# Patient Record
Sex: Male | Born: 1935 | Race: White | Hispanic: No
Health system: Southern US, Community
[De-identification: ages and names within clinical notes are randomized; demographics above are authoritative.]

---

## 2012-04-09 ENCOUNTER — Inpatient Hospital Stay: Payer: Self-pay | Admitting: Internal Medicine

## 2012-04-09 LAB — URINALYSIS, COMPLETE
Bilirubin,UR: NEGATIVE
Blood: NEGATIVE
Hyaline Cast: 1
Ketone: NEGATIVE
Leukocyte Esterase: NEGATIVE
Nitrite: NEGATIVE
Ph: 6 (ref 4.5–8.0)
Protein: NEGATIVE
RBC,UR: <1 /HPF (ref 0–5)
Squamous Epithelial: NONE SEEN

## 2012-04-09 LAB — TROPONIN I: Troponin-I: 0.03 ng/mL

## 2012-04-09 LAB — BASIC METABOLIC PANEL
BUN: 65 mg/dL — ABNORMAL HIGH (ref 7–18)
Calcium, Total: 9.2 mg/dL (ref 8.5–10.1)
Chloride: 97 mmol/L — ABNORMAL LOW (ref 98–107)
Creatinine: 1.76 mg/dL — ABNORMAL HIGH (ref 0.60–1.30)
Potassium: 4.5 mmol/L (ref 3.5–5.1)
Sodium: 134 mmol/L — ABNORMAL LOW (ref 136–145)

## 2012-04-09 LAB — CBC
HCT: 34.9 % — ABNORMAL LOW (ref 40.0–52.0)
HGB: 11.5 g/dL — ABNORMAL LOW (ref 13.0–18.0)
RBC: 3.52 10*6/uL — ABNORMAL LOW (ref 4.40–5.90)
WBC: 9.2 10*3/uL (ref 3.8–10.6)

## 2012-04-09 LAB — CK TOTAL AND CKMB (NOT AT ARMC)
CK, Total: 96 U/L (ref 35–232)
CK-MB: 0.8 ng/mL (ref 0.5–3.6)

## 2012-04-09 LAB — PHENOBARBITAL LEVEL: Phenobarbital: 27.3 ug/mL (ref 15.0–40.0)

## 2012-04-09 LAB — HEPATIC FUNCTION PANEL A (ARMC)
Albumin: 3.4 g/dL (ref 3.4–5.0)
Alkaline Phosphatase: 114 U/L (ref 50–136)
Bilirubin, Direct: 0.2 mg/dL (ref 0.00–0.20)
Bilirubin,Total: 0.5 mg/dL (ref 0.2–1.0)
SGPT (ALT): 18 U/L
Total Protein: 7.7 g/dL (ref 6.4–8.2)

## 2012-04-09 LAB — PRO B NATRIURETIC PEPTIDE: B-Type Natriuretic Peptide: 6654 pg/mL — ABNORMAL HIGH (ref 0–450)

## 2012-04-10 LAB — BASIC METABOLIC PANEL
Anion Gap: 10 (ref 7–16)
Calcium, Total: 9.1 mg/dL (ref 8.5–10.1)
Chloride: 98 mmol/L (ref 98–107)
Co2: 29 mmol/L (ref 21–32)
EGFR (African American): 38 — ABNORMAL LOW
Glucose: 220 mg/dL — ABNORMAL HIGH (ref 65–99)
Osmolality: 300 (ref 275–301)

## 2012-04-10 LAB — CBC WITH DIFFERENTIAL/PLATELET
Eosinophil %: 0.1 %
HGB: 11.1 g/dL — ABNORMAL LOW (ref 13.0–18.0)
Lymphocyte %: 6 %
MCH: 32.4 pg (ref 26.0–34.0)
Monocyte #: 0.2 x10 3/mm (ref 0.2–1.0)
Monocyte %: 2.1 %
Neutrophil %: 91.4 %
Platelet: 181 10*3/uL (ref 150–440)
RBC: 3.44 10*6/uL — ABNORMAL LOW (ref 4.40–5.90)
WBC: 9 10*3/uL (ref 3.8–10.6)

## 2012-04-10 LAB — CK TOTAL AND CKMB (NOT AT ARMC)
CK, Total: 87 U/L (ref 35–232)
CK-MB: 0.9 ng/mL (ref 0.5–3.6)

## 2012-04-10 LAB — TROPONIN I: Troponin-I: 0.06 ng/mL — ABNORMAL HIGH

## 2012-04-11 LAB — BASIC METABOLIC PANEL
Anion Gap: 7 (ref 7–16)
BUN: 75 mg/dL — ABNORMAL HIGH (ref 7–18)
Chloride: 97 mmol/L — ABNORMAL LOW (ref 98–107)
Creatinine: 2.05 mg/dL — ABNORMAL HIGH (ref 0.60–1.30)
EGFR (African American): 33 — ABNORMAL LOW
Glucose: 114 mg/dL — ABNORMAL HIGH (ref 65–99)

## 2012-04-12 LAB — BASIC METABOLIC PANEL
Anion Gap: 7 (ref 7–16)
Calcium, Total: 9.3 mg/dL (ref 8.5–10.1)
Creatinine: 1.73 mg/dL — ABNORMAL HIGH (ref 0.60–1.30)
EGFR (African American): 41 — ABNORMAL LOW
EGFR (Non-African Amer.): 35 — ABNORMAL LOW
Glucose: 164 mg/dL — ABNORMAL HIGH (ref 65–99)
Potassium: 3.9 mmol/L (ref 3.5–5.1)
Sodium: 139 mmol/L (ref 136–145)

## 2012-04-13 LAB — CBC WITH DIFFERENTIAL/PLATELET
Eosinophil #: 0 10*3/uL (ref 0.0–0.7)
Eosinophil %: 0.1 %
Lymphocyte #: 1 10*3/uL (ref 1.0–3.6)
MCH: 32.6 pg (ref 26.0–34.0)
MCHC: 32.4 g/dL (ref 32.0–36.0)
MCV: 101 fL — ABNORMAL HIGH (ref 80–100)
Monocyte #: 0.9 x10 3/mm (ref 0.2–1.0)
Monocyte %: 8 %
Neutrophil #: 9 10*3/uL — ABNORMAL HIGH (ref 1.4–6.5)
Neutrophil %: 83 %
Platelet: 185 10*3/uL (ref 150–440)
WBC: 10.9 10*3/uL — ABNORMAL HIGH (ref 3.8–10.6)

## 2012-04-15 LAB — COMPREHENSIVE METABOLIC PANEL
BUN: 40 mg/dL — ABNORMAL HIGH (ref 7–18)
Bilirubin,Total: 0.8 mg/dL (ref 0.2–1.0)
Chloride: 105 mmol/L (ref 98–107)
Creatinine: 1.52 mg/dL — ABNORMAL HIGH (ref 0.60–1.30)
EGFR (African American): 47 — ABNORMAL LOW
EGFR (Non-African Amer.): 41 — ABNORMAL LOW
Glucose: 109 mg/dL — ABNORMAL HIGH (ref 65–99)
Osmolality: 297 (ref 275–301)
Potassium: 3.4 mmol/L — ABNORMAL LOW (ref 3.5–5.1)
SGOT(AST): 36 U/L (ref 15–37)
SGPT (ALT): 38 U/L
Sodium: 144 mmol/L (ref 136–145)

## 2012-04-15 LAB — URINE CULTURE

## 2012-04-19 LAB — CULTURE, BLOOD (SINGLE)

## 2012-09-25 ENCOUNTER — Inpatient Hospital Stay: Payer: Self-pay | Admitting: Specialist

## 2012-09-25 LAB — APTT: Activated PTT: 24.1 secs (ref 23.6–35.9)

## 2012-09-25 LAB — CBC
HCT: 36.1 % — ABNORMAL LOW (ref 40.0–52.0)
HGB: 11.7 g/dL — ABNORMAL LOW (ref 13.0–18.0)
MCHC: 32.4 g/dL (ref 32.0–36.0)
MCV: 100 fL (ref 80–100)
RDW: 16.3 % — ABNORMAL HIGH (ref 11.5–14.5)
WBC: 6.5 10*3/uL (ref 3.8–10.6)

## 2012-09-25 LAB — COMPREHENSIVE METABOLIC PANEL
Alkaline Phosphatase: 156 U/L — ABNORMAL HIGH (ref 50–136)
Calcium, Total: 9 mg/dL (ref 8.5–10.1)
Co2: 29 mmol/L (ref 21–32)
EGFR (Non-African Amer.): 51 — ABNORMAL LOW
Glucose: 88 mg/dL (ref 65–99)
SGPT (ALT): 21 U/L (ref 12–78)

## 2012-09-26 LAB — CBC WITH DIFFERENTIAL/PLATELET
Basophil %: 0.2 %
Eosinophil #: 0 10*3/uL (ref 0.0–0.7)
HCT: 34.8 % — ABNORMAL LOW (ref 40.0–52.0)
HGB: 11.2 g/dL — ABNORMAL LOW (ref 13.0–18.0)
Lymphocyte #: 0.7 10*3/uL — ABNORMAL LOW (ref 1.0–3.6)
Lymphocyte %: 6 %
MCH: 32.5 pg (ref 26.0–34.0)
Monocyte #: 0.8 x10 3/mm (ref 0.2–1.0)
Monocyte %: 6.7 %
Neutrophil #: 10 10*3/uL — ABNORMAL HIGH (ref 1.4–6.5)
Platelet: 137 10*3/uL — ABNORMAL LOW (ref 150–440)
RDW: 16.5 % — ABNORMAL HIGH (ref 11.5–14.5)
WBC: 11.5 10*3/uL — ABNORMAL HIGH (ref 3.8–10.6)

## 2012-09-26 LAB — BASIC METABOLIC PANEL
BUN: 54 mg/dL — ABNORMAL HIGH (ref 7–18)
Calcium, Total: 9 mg/dL (ref 8.5–10.1)
Creatinine: 1.43 mg/dL — ABNORMAL HIGH (ref 0.60–1.30)
EGFR (African American): 51 — ABNORMAL LOW
EGFR (Non-African Amer.): 44 — ABNORMAL LOW
Glucose: 175 mg/dL — ABNORMAL HIGH (ref 65–99)
Sodium: 140 mmol/L (ref 136–145)

## 2012-09-26 LAB — PROTIME-INR: Prothrombin Time: 13.6 secs (ref 11.5–14.7)

## 2012-09-27 LAB — POTASSIUM: Potassium: 4.9 mmol/L (ref 3.5–5.1)

## 2012-09-28 LAB — BASIC METABOLIC PANEL
BUN: 53 mg/dL — ABNORMAL HIGH (ref 7–18)
Calcium, Total: 8.5 mg/dL (ref 8.5–10.1)
EGFR (Non-African Amer.): 45 — ABNORMAL LOW
Glucose: 128 mg/dL — ABNORMAL HIGH (ref 65–99)
Osmolality: 292 (ref 275–301)
Potassium: 4.3 mmol/L (ref 3.5–5.1)
Sodium: 138 mmol/L (ref 136–145)

## 2012-09-28 LAB — HEMOGLOBIN: HGB: 10.3 g/dL — ABNORMAL LOW (ref 13.0–18.0)

## 2012-09-29 LAB — HEMOGLOBIN: HGB: 10.3 g/dL — ABNORMAL LOW (ref 13.0–18.0)

## 2012-09-30 LAB — BASIC METABOLIC PANEL
BUN: 38 mg/dL — ABNORMAL HIGH (ref 7–18)
Co2: 26 mmol/L (ref 21–32)
Creatinine: 1.04 mg/dL (ref 0.60–1.30)
EGFR (African American): >60
EGFR (Non-African Amer.): >60
Sodium: 136 mmol/L (ref 136–145)

## 2012-09-30 LAB — CBC WITH DIFFERENTIAL/PLATELET
Basophil %: 0.6 %
Eosinophil #: 0.4 10*3/uL (ref 0.0–0.7)
Eosinophil %: 5.1 %
HGB: 10.1 g/dL — ABNORMAL LOW (ref 13.0–18.0)
Lymphocyte #: 0.7 10*3/uL — ABNORMAL LOW (ref 1.0–3.6)
MCH: 32.1 pg (ref 26.0–34.0)
MCHC: 32.3 g/dL (ref 32.0–36.0)
MCV: 100 fL (ref 80–100)
Monocyte #: 0.7 x10 3/mm (ref 0.2–1.0)
Neutrophil #: 6.6 10*3/uL — ABNORMAL HIGH (ref 1.4–6.5)
RBC: 3.16 10*6/uL — ABNORMAL LOW (ref 4.40–5.90)

## 2012-12-08 ENCOUNTER — Ambulatory Visit: Payer: Self-pay | Admitting: Internal Medicine

## 2012-12-22 ENCOUNTER — Other Ambulatory Visit: Payer: Self-pay | Admitting: Family Medicine

## 2012-12-22 LAB — CBC WITH DIFFERENTIAL/PLATELET
Basophil %: 0.3 %
Eosinophil %: 0.2 %
Lymphocyte #: 1.8 10*3/uL (ref 1.0–3.6)
Lymphocyte %: 15.6 %
MCH: 32 pg (ref 26.0–34.0)
MCV: 98 fL (ref 80–100)
Monocyte #: 0.7 x10 3/mm (ref 0.2–1.0)
Neutrophil %: 77.5 %
Platelet: 246 10*3/uL (ref 150–440)
RDW: 16.5 % — ABNORMAL HIGH (ref 11.5–14.5)

## 2012-12-22 LAB — URINALYSIS, COMPLETE
Bilirubin,UR: NEGATIVE
Nitrite: NEGATIVE
Ph: 5 (ref 4.5–8.0)
Protein: 100
RBC,UR: 11 /HPF (ref 0–5)
Squamous Epithelial: NONE SEEN
WBC UR: 5082 /HPF (ref 0–5)

## 2012-12-22 LAB — COMPREHENSIVE METABOLIC PANEL
Albumin: 3.4 g/dL (ref 3.4–5.0)
Alkaline Phosphatase: 156 U/L — ABNORMAL HIGH (ref 50–136)
BUN: 135 mg/dL — ABNORMAL HIGH (ref 7–18)
Bilirubin,Total: 0.3 mg/dL (ref 0.2–1.0)
Calcium, Total: 8.8 mg/dL (ref 8.5–10.1)
Chloride: 98 mmol/L (ref 98–107)
Creatinine: 4.27 mg/dL — ABNORMAL HIGH (ref 0.60–1.30)
Osmolality: 313 (ref 275–301)
Potassium: 4.8 mmol/L (ref 3.5–5.1)
SGPT (ALT): 36 U/L (ref 12–78)
Sodium: 134 mmol/L — ABNORMAL LOW (ref 136–145)

## 2012-12-24 ENCOUNTER — Inpatient Hospital Stay: Payer: Self-pay | Admitting: Family Medicine

## 2012-12-24 LAB — URINALYSIS, COMPLETE
Bilirubin,UR: NEGATIVE
Glucose,UR: NEGATIVE mg/dL (ref 0–75)
Nitrite: NEGATIVE
Ph: 5 (ref 4.5–8.0)
Protein: 100
RBC,UR: 102 /HPF (ref 0–5)
Squamous Epithelial: 13

## 2012-12-24 LAB — COMPREHENSIVE METABOLIC PANEL
Albumin: 3.1 g/dL — ABNORMAL LOW (ref 3.4–5.0)
Anion Gap: 14 (ref 7–16)
BUN: 141 mg/dL — ABNORMAL HIGH (ref 7–18)
Chloride: 102 mmol/L (ref 98–107)
Co2: 23 mmol/L (ref 21–32)
Glucose: 91 mg/dL (ref 65–99)
Osmolality: 323 (ref 275–301)
SGOT(AST): 59 U/L — ABNORMAL HIGH (ref 15–37)
SGPT (ALT): 31 U/L (ref 12–78)
Total Protein: 8.4 g/dL — ABNORMAL HIGH (ref 6.4–8.2)

## 2012-12-24 LAB — URINE CULTURE

## 2012-12-24 LAB — CBC WITH DIFFERENTIAL/PLATELET
Basophil #: 0 10*3/uL (ref 0.0–0.1)
Basophil %: 0.2 %
Eosinophil %: 0.5 %
HCT: 36.7 % — ABNORMAL LOW (ref 40.0–52.0)
HGB: 11.8 g/dL — ABNORMAL LOW (ref 13.0–18.0)
Lymphocyte %: 9 %
MCHC: 32 g/dL (ref 32.0–36.0)
MCV: 97 fL (ref 80–100)
Monocyte #: 1 x10 3/mm (ref 0.2–1.0)
Monocyte %: 7.9 %
Neutrophil #: 10 10*3/uL — ABNORMAL HIGH (ref 1.4–6.5)
Neutrophil %: 82.4 %
Platelet: 223 10*3/uL (ref 150–440)
WBC: 12.1 10*3/uL — ABNORMAL HIGH (ref 3.8–10.6)

## 2012-12-24 LAB — CK TOTAL AND CKMB (NOT AT ARMC)
CK, Total: 272 U/L — ABNORMAL HIGH (ref 35–232)
CK-MB: 8.8 ng/mL — ABNORMAL HIGH (ref 0.5–3.6)
CK-MB: 6.5 ng/mL — ABNORMAL HIGH (ref 0.5–3.6)

## 2012-12-24 LAB — AMMONIA: Ammonia, Plasma: 55 mcmol/L — ABNORMAL HIGH (ref 11–32)

## 2012-12-24 LAB — TROPONIN I
Troponin-I: 0.13 ng/mL — ABNORMAL HIGH
Troponin-I: 0.24 ng/mL — ABNORMAL HIGH

## 2012-12-24 LAB — SODIUM, URINE, RANDOM: Sodium, Urine Random: 30 mmol/L (ref 20–110)

## 2012-12-25 LAB — CBC WITH DIFFERENTIAL/PLATELET
Basophil #: 0 10*3/uL (ref 0.0–0.1)
Eosinophil #: 0 10*3/uL (ref 0.0–0.7)
HCT: 34.1 % — ABNORMAL LOW (ref 40.0–52.0)
Lymphocyte %: 14.4 %
MCHC: 32.3 g/dL (ref 32.0–36.0)
Monocyte #: 0.6 x10 3/mm (ref 0.2–1.0)
Monocyte %: 6.7 %
Neutrophil #: 6.6 10*3/uL — ABNORMAL HIGH (ref 1.4–6.5)
Neutrophil %: 78.5 %
RBC: 3.48 10*6/uL — ABNORMAL LOW (ref 4.40–5.90)
WBC: 8.4 10*3/uL (ref 3.8–10.6)

## 2012-12-25 LAB — BASIC METABOLIC PANEL
Anion Gap: 16 (ref 7–16)
Co2: 18 mmol/L — ABNORMAL LOW (ref 21–32)
Creatinine: 1.95 mg/dL — ABNORMAL HIGH (ref 0.60–1.30)
EGFR (African American): 35 — ABNORMAL LOW
EGFR (Non-African Amer.): 30 — ABNORMAL LOW
Osmolality: 329 (ref 275–301)
Potassium: 4.3 mmol/L (ref 3.5–5.1)
Sodium: 147 mmol/L — ABNORMAL HIGH (ref 136–145)

## 2012-12-25 LAB — PROTEIN ELECTROPHORESIS(ARMC)

## 2012-12-25 LAB — AMMONIA: Ammonia, Plasma: 52 mcmol/L — ABNORMAL HIGH (ref 11–32)

## 2012-12-25 LAB — PROTEIN / CREATININE RATIO, URINE
Creatinine, Urine: 81.3 mg/dL (ref 30.0–125.0)
Protein, Random Urine: 73 mg/dL — ABNORMAL HIGH (ref 0–12)
Protein/Creat. Ratio: 898 mg/gCREAT — ABNORMAL HIGH (ref 0–200)

## 2012-12-25 LAB — CK TOTAL AND CKMB (NOT AT ARMC)
CK, Total: 180 U/L (ref 35–232)
CK-MB: 8.5 ng/mL — ABNORMAL HIGH (ref 0.5–3.6)

## 2012-12-26 LAB — BASIC METABOLIC PANEL
Anion Gap: 12 (ref 7–16)
Co2: 22 mmol/L (ref 21–32)
Creatinine: 1.43 mg/dL — ABNORMAL HIGH (ref 0.60–1.30)
EGFR (African American): 51 — ABNORMAL LOW
EGFR (Non-African Amer.): 44 — ABNORMAL LOW
Glucose: 109 mg/dL — ABNORMAL HIGH (ref 65–99)
Osmolality: 338 (ref 275–301)
Potassium: 4.1 mmol/L (ref 3.5–5.1)
Sodium: 156 mmol/L — ABNORMAL HIGH (ref 136–145)

## 2012-12-26 LAB — UR PROT ELECTROPHORESIS, URINE RANDOM

## 2012-12-26 LAB — URINE CULTURE

## 2012-12-27 LAB — CBC WITH DIFFERENTIAL/PLATELET
Basophil #: 0 10*3/uL (ref 0.0–0.1)
Eosinophil %: 0 %
HCT: 34.3 % — ABNORMAL LOW (ref 40.0–52.0)
HGB: 11.2 g/dL — ABNORMAL LOW (ref 13.0–18.0)
Lymphocyte #: 1 10*3/uL (ref 1.0–3.6)
Lymphocyte %: 12.5 %
MCHC: 32.5 g/dL (ref 32.0–36.0)
MCV: 99 fL (ref 80–100)
Neutrophil #: 6.4 10*3/uL (ref 1.4–6.5)
Neutrophil %: 76.3 %
RBC: 3.47 10*6/uL — ABNORMAL LOW (ref 4.40–5.90)
WBC: 8.3 10*3/uL (ref 3.8–10.6)

## 2012-12-27 LAB — BASIC METABOLIC PANEL
Anion Gap: 6 — ABNORMAL LOW (ref 7–16)
BUN: 71 mg/dL — ABNORMAL HIGH (ref 7–18)
Co2: 29 mmol/L (ref 21–32)
EGFR (African American): 46 — ABNORMAL LOW
EGFR (Non-African Amer.): 40 — ABNORMAL LOW
Glucose: 209 mg/dL — ABNORMAL HIGH (ref 65–99)
Osmolality: 331 (ref 275–301)
Potassium: 3.7 mmol/L (ref 3.5–5.1)
Sodium: 153 mmol/L — ABNORMAL HIGH (ref 136–145)

## 2012-12-29 LAB — CULTURE, BLOOD (SINGLE)

## 2013-01-08 ENCOUNTER — Ambulatory Visit: Payer: Self-pay | Admitting: Internal Medicine

## 2013-01-08 DEATH — deceased

## 2013-09-16 IMAGING — CT CT HEAD WITHOUT CONTRAST
1 of 2 series · 13 of 30 positions shown, 17 images · non-contrast
Comparison: none

REASON FOR EXAM: YELLING, CONFUSED
COMMENTS:

PROCEDURE:     CT  - CT HEAD WITHOUT CONTRAST  - December 24, 2012  [DATE]
RESULT:     Comparison:  09/24/2012, 09/25/2012
TECHNIQUE: Multiple axial images from the foramen magnum to the vertex were
obtained without IV contrast.

[Series 6: (id) · axial · 0.49mm/px · z∈[-129,-3]mm · 13 of 35 slices shown, 17 images]
[im 3/35  brain]
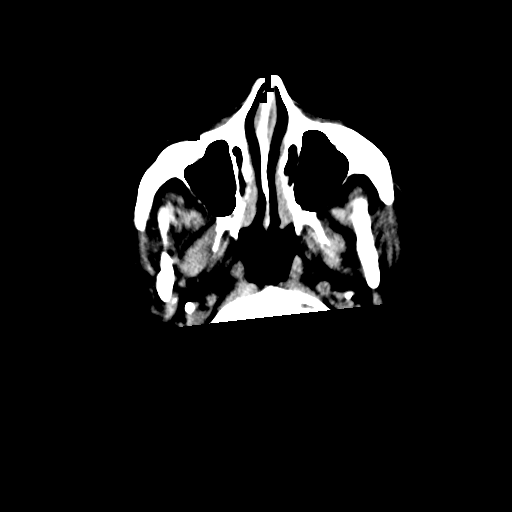
[im 3/35  bone]
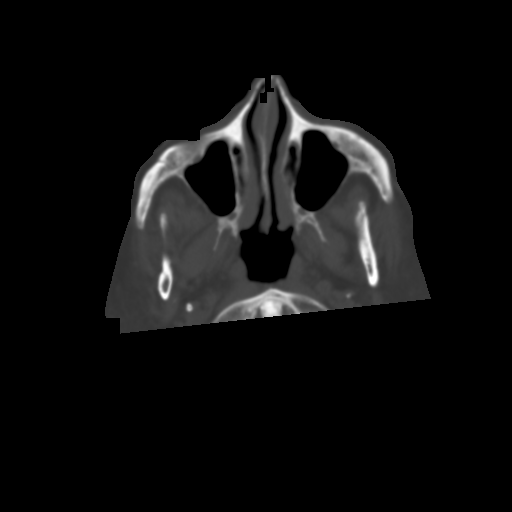
[im 5/35  brain]
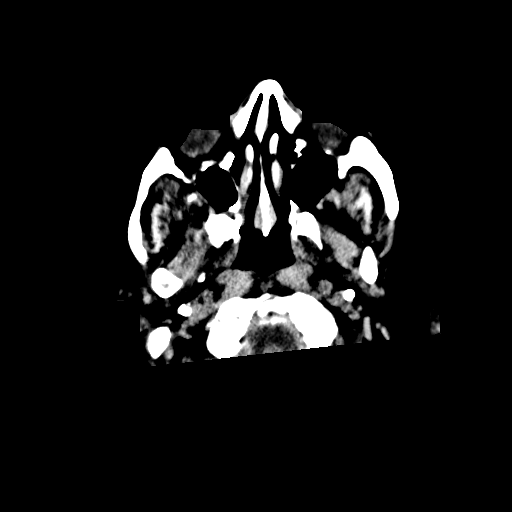
[im 8/35  brain]
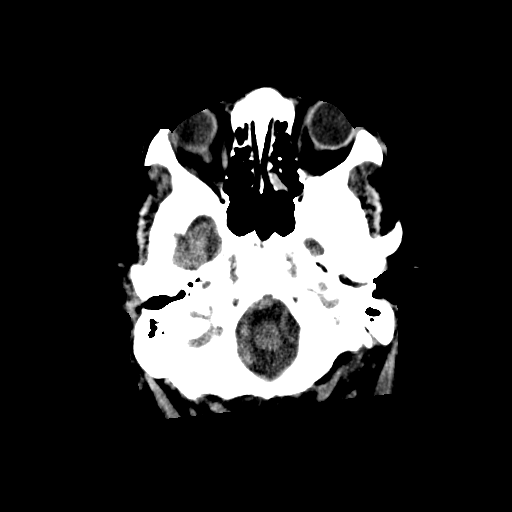
[im 10/35  brain]
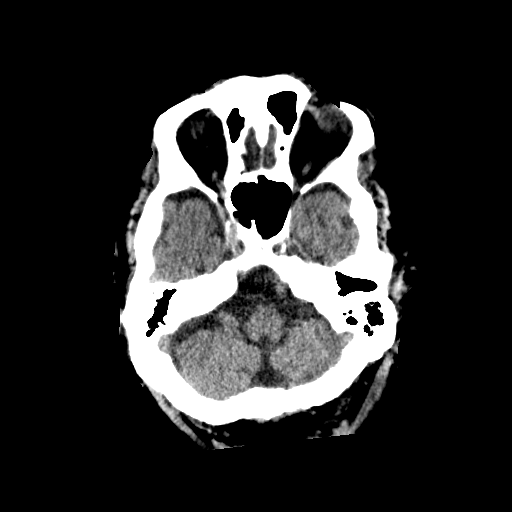
[im 13/35  brain]
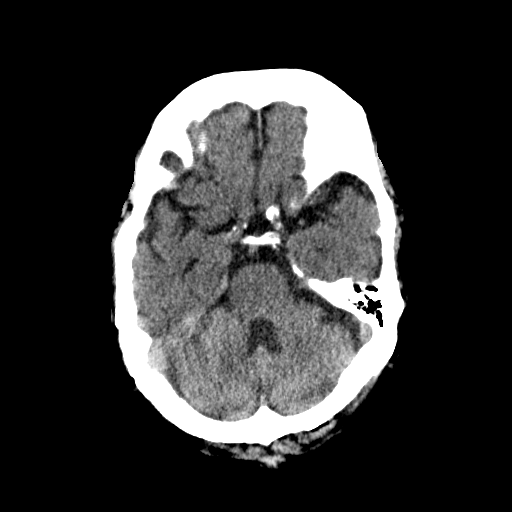
[im 13/35  bone]
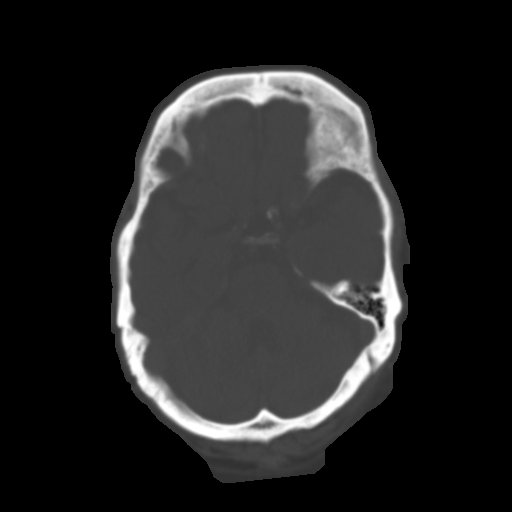
[im 15/35  brain]
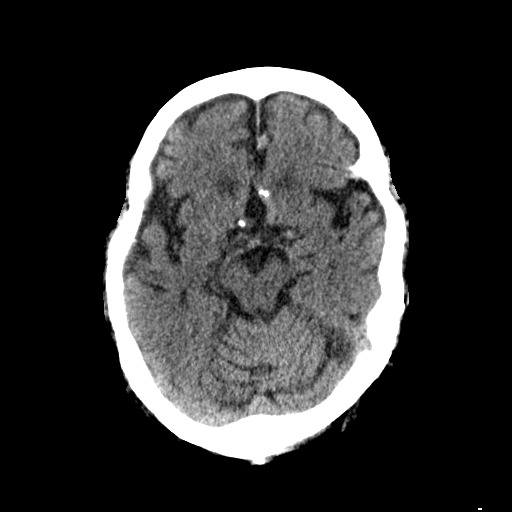
[im 18/35  brain]
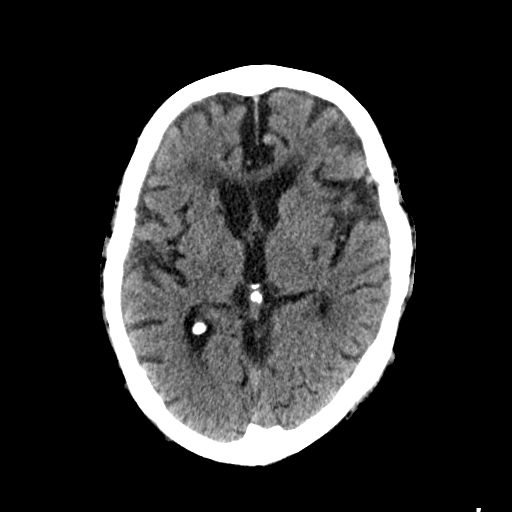
[im 20/35  brain]
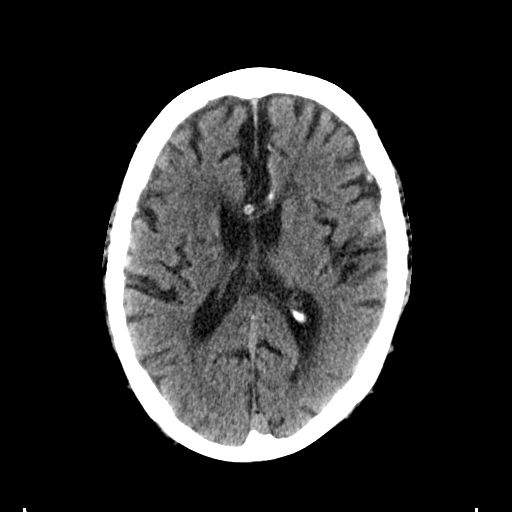
[im 22/35  brain]
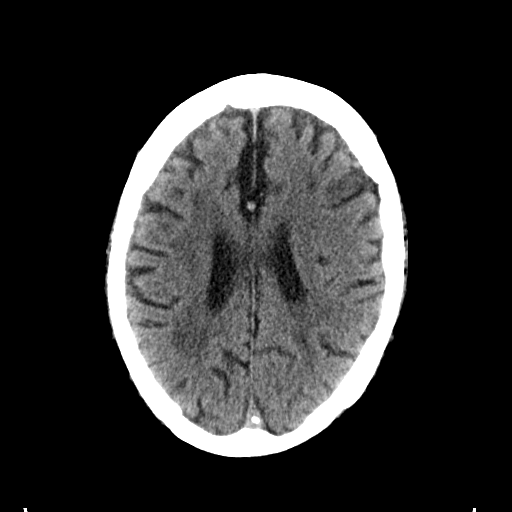
[im 22/35  bone]
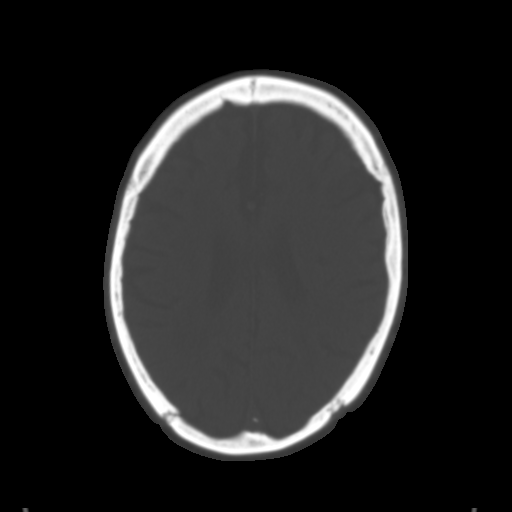
[im 25/35  brain]
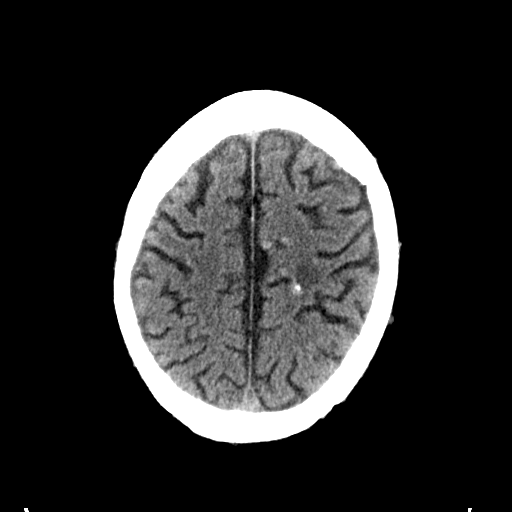
[im 27/35  brain]
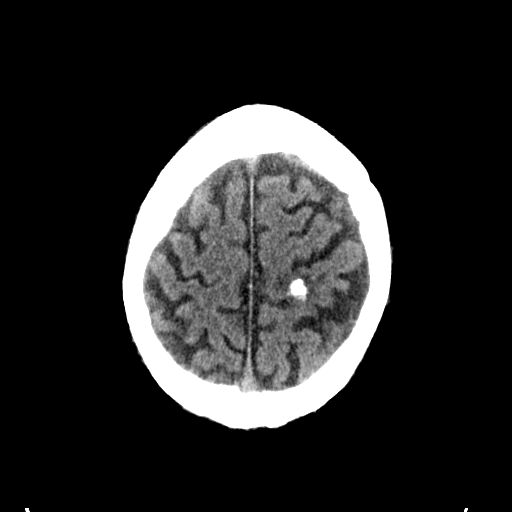
[im 30/35  brain]
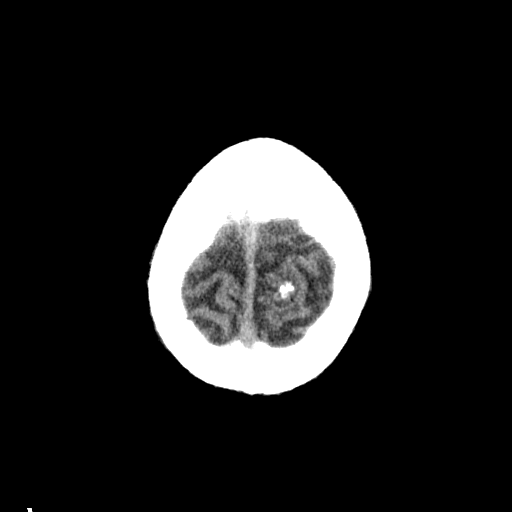
[im 32/35  brain]
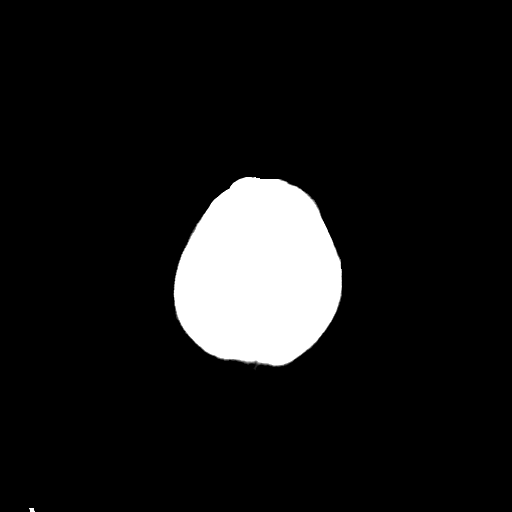
[im 32/35  bone]
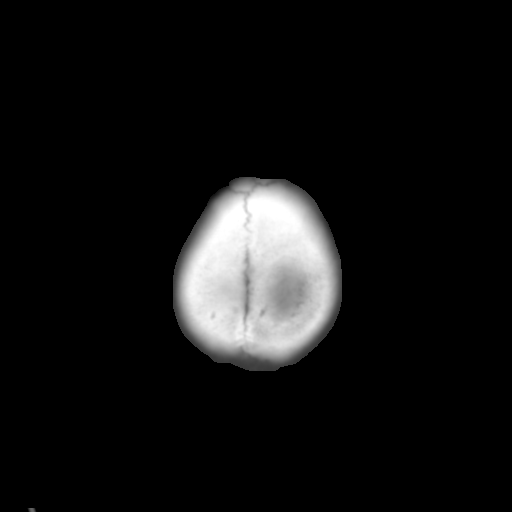

[13 of 30 positions shown; findings below may reference images not displayed]

FINDINGS: Periventricular and subcortical hypoattenuation is likely sequela chronic
microangiopathy. There is mild cerebral atrophy. No intracranial hemorrhage.
No extra-axial fluid collection. No evidence of acute cortical based
infarct. Coarse calcifications in the left frontoparietal lobe near the
vertex appear to be relatively tubular and in keeping with the previously
evaluated vascular malformation. The minimal adjacent hypoattenuation is
similar to prior.  There is an associated prominent, partially calcified
vessel in the falx.

The osseous structures are unremarkable.
IMPRESSION: 1. No acute intracranial process.
2. Chronic small vessel ischemic disease.
3. Findings in keeping with the previously described vascular malformation
in the left frontoparietal lobe.

## 2013-09-16 IMAGING — CT CT ABD-PELV W/O CM
1 of 2 series · 15 of 32 positions shown, 19 images · non-contrast
Comparison: none

REASON FOR EXAM: (1) abnormal ultrasound with free air in GB; (2)
abnormal ultrasound
COMMENTS:

PROCEDURE:     CT  - CT ABDOMEN AND PELVIS W[DATE] [DATE]
RESULT:     History: Gallstones. Possible emphysematous cholecystitis.

[Series 2: 3mm soft tissue · axial · 0.80mm/px · z∈[-773,-284]mm · 15 of 179 slices shown, 19 images]
[im 8/179  soft-tissue]
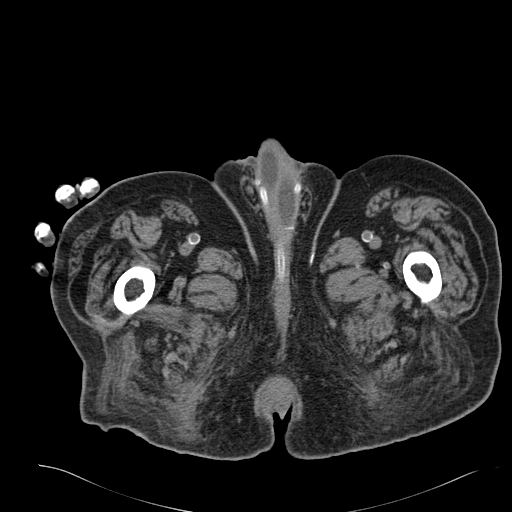
[im 8/179  bone]
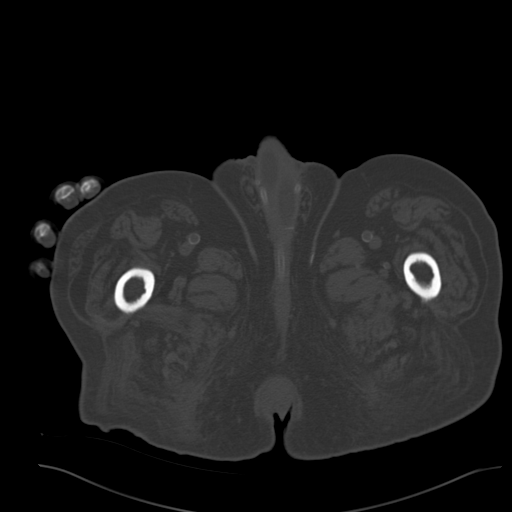
[im 23/179  soft-tissue]
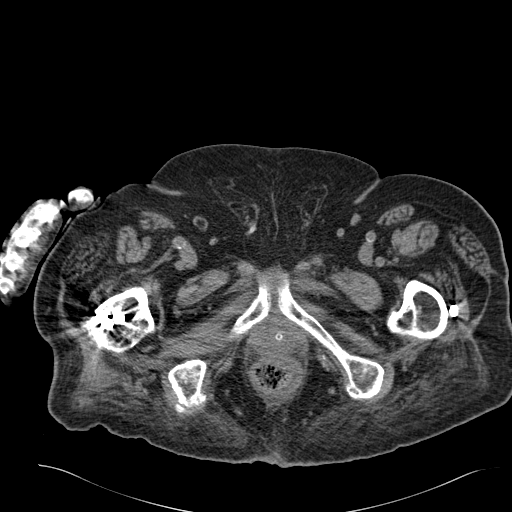
[im 38/179  soft-tissue]
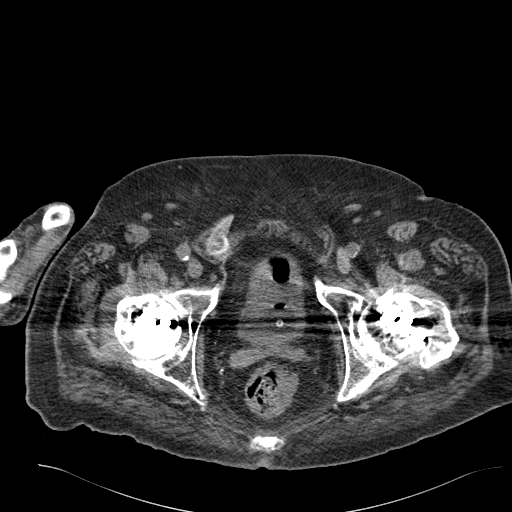
[im 52/179  soft-tissue]
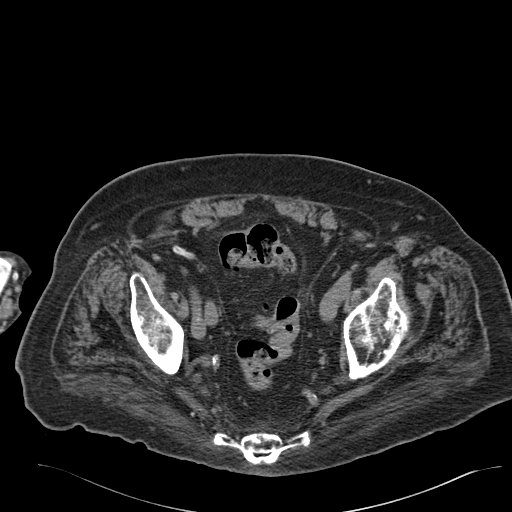
[im 60/179  soft-tissue]
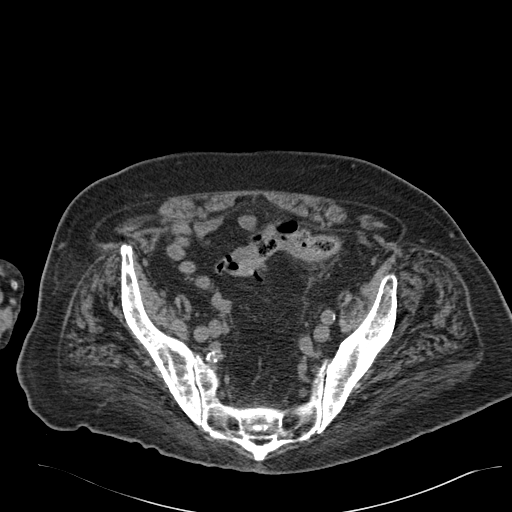
[im 75/179  soft-tissue]
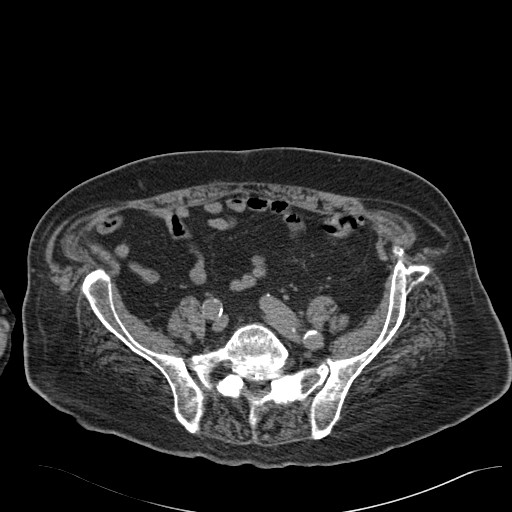
[im 90/179  soft-tissue]
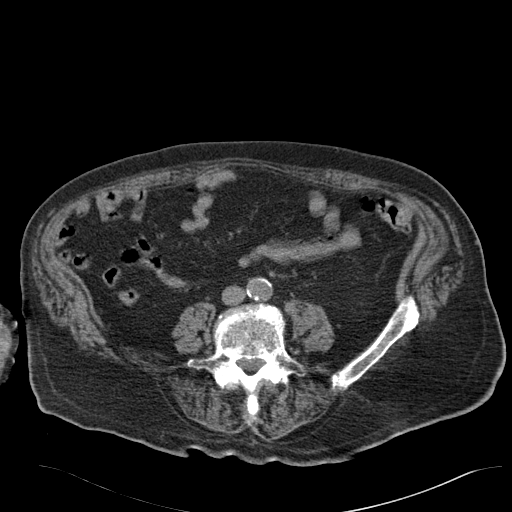
[im 104/179  soft-tissue]
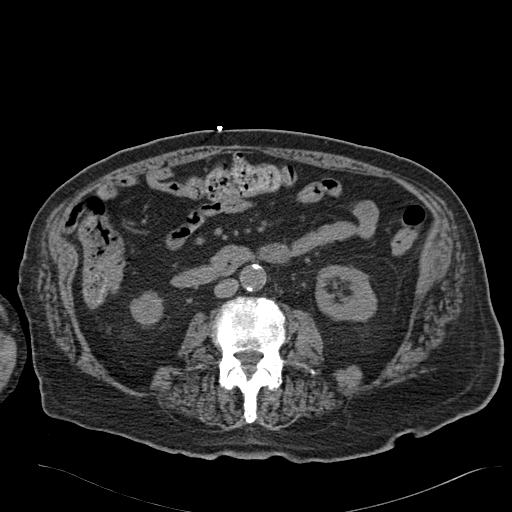
[im 119/179  soft-tissue]
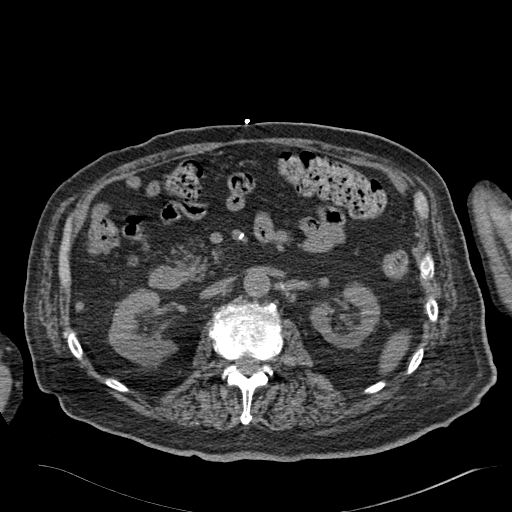
[im 119/179  bone]
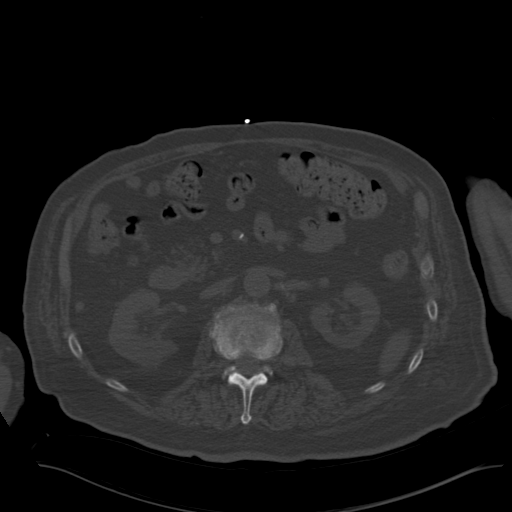
[im 127/179  soft-tissue]
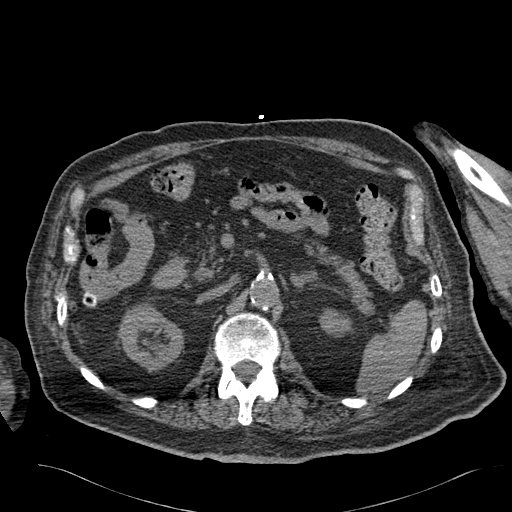
[im 141/179  soft-tissue]
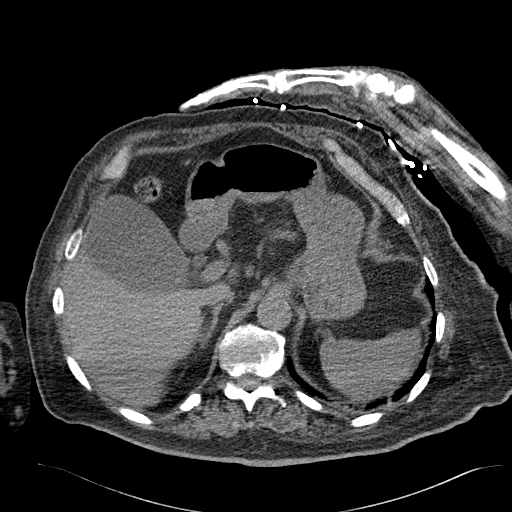
[im 149/179  lung]
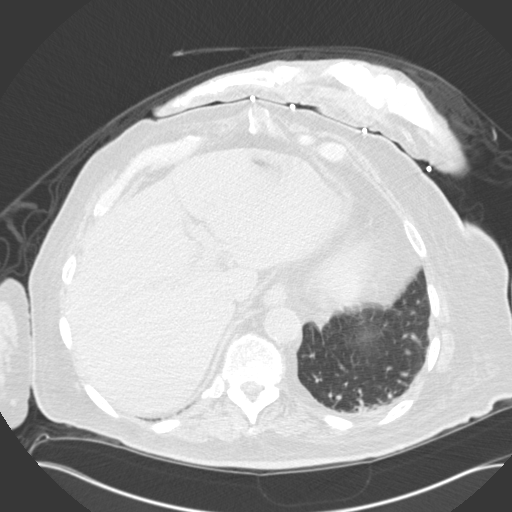
[im 156/179  soft-tissue]
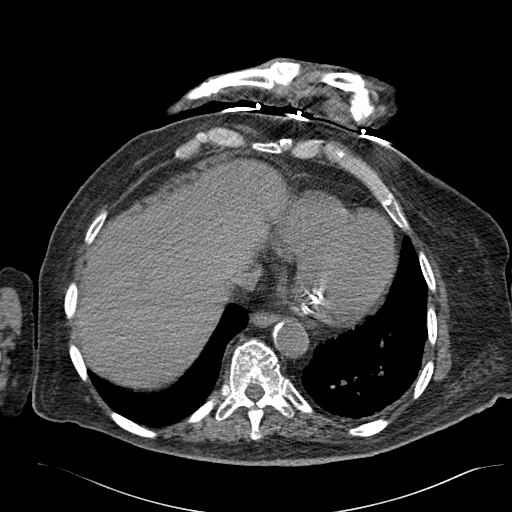
[im 156/179  lung]
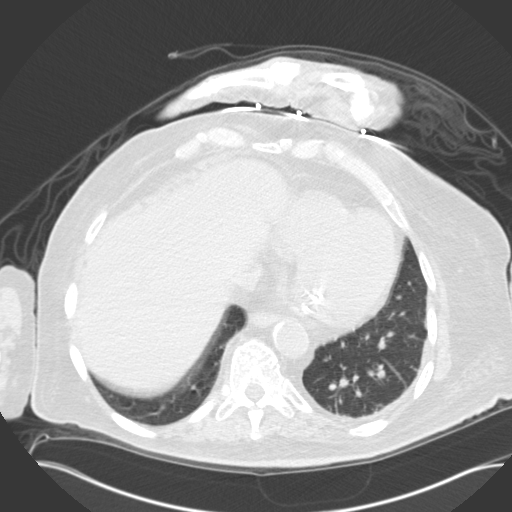
[im 164/179  lung]
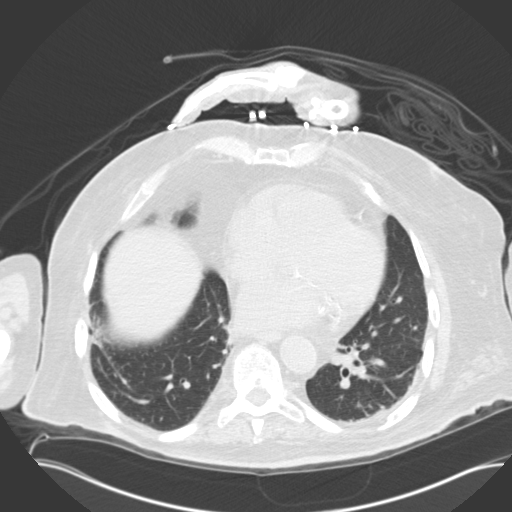
[im 171/179  soft-tissue]
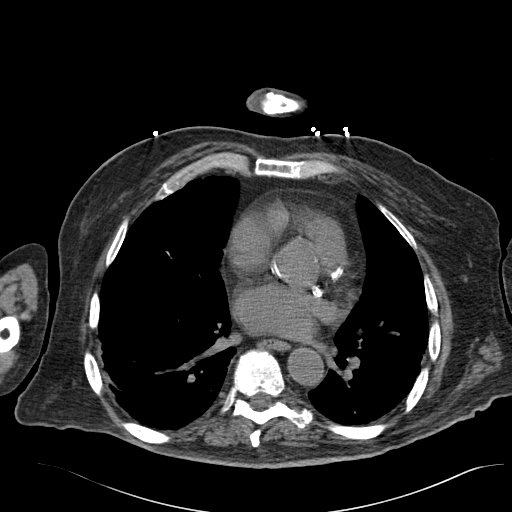
[im 171/179  lung]
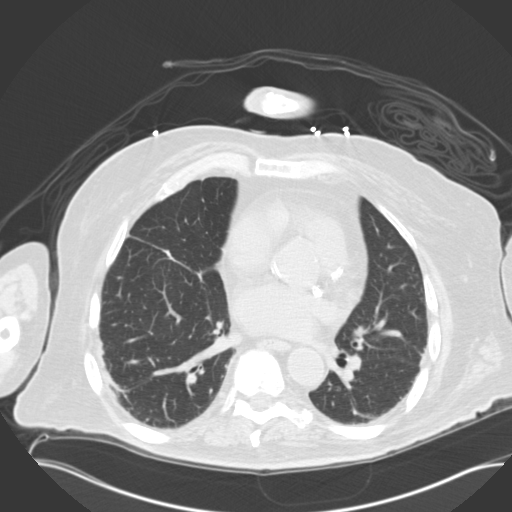

[15 of 32 positions shown; findings below may reference images not displayed]

FINDINGS: Standard nonenhanced CT obtained. Liver normal. Gallbladder is
distended and contains gallstones. No air noted within the gallbladder wall.
Common bile duct measures 11 mm. Pancreas is normal. Adrenals are normal. No
obstructing ureteral stone. No hydronephrosis. Aorta normal caliber.
Appendix normal. No evidence of bowel obstruction. The catheter bladder.
Bladder nondistended. Penile prosthesis. Prior bilateral hip surgery. Very
mild changes of sigmoid colonic diverticulitis cannot be excluded.
IMPRESSION: Distended gallbladder with gallstones. No evidence of
emphysematous cholecystitis. Common bile duct measures 11 mm. No obstructing
abnormality identified. Pancreas is unremarkable. Very mild changes of
sigmoid colonic diverticulitis cannot be excluded.

## 2013-09-18 IMAGING — CR DG CHEST 1V PORT
1 series · 1 of 1 positions shown · non-contrast
Comparison: none

REASON FOR EXAM: aspiration.
COMMENTS:

PROCEDURE:     DXR - DXR PORTABLE CHEST SINGLE VIEW  - December 26, 2012 [DATE]
RESULT:     Comparison: None

[ap]
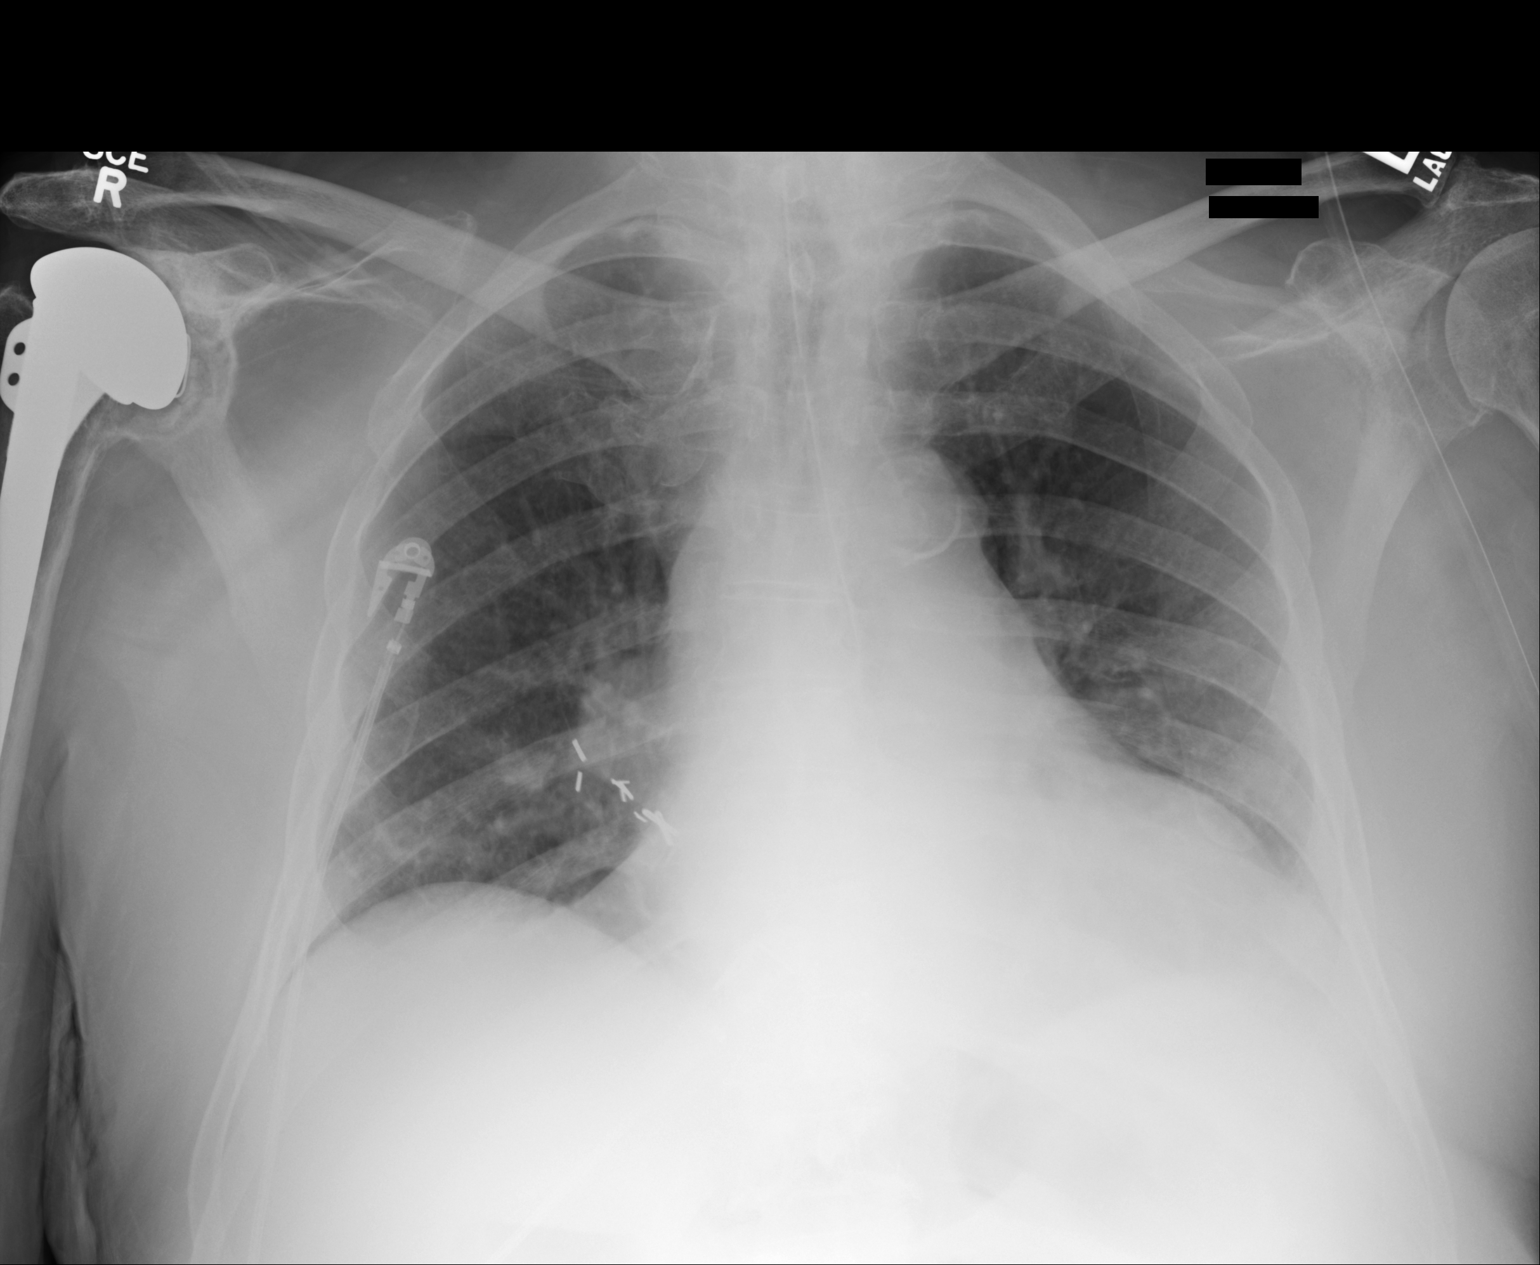

[1 of 1 positions shown; findings below may reference images not displayed]

FINDINGS: Single portable AP chest radiograph is provided.  There is no focal
parenchymal opacity, pleural effusion, or pneumothorax. Normal
cardiomediastinal silhouette. The osseous structures are unremarkable.
IMPRESSION: No acute disease of the che[REDACTED]

## 2015-01-27 NOTE — Op Note (Signed)
PATIENT NAME:  Clayton Campbell, Clayton Campbell MR#:  [id number] DATE OF BIRTH:  30-May-1924  DATE OF PROCEDURE:  09/27/2012  PREOPERATIVE DIAGNOSIS: Displaced left femoral neck fracture.   POSTOPERATIVE DIAGNOSIS: Displaced left femoral neck fracture.   PROCEDURE: Reduction and internal fixation of left femoral neck fracture.   SURGEON: Myra Rudehristopher Dewitt Judice, MD.   ANESTHESIA: General.   COMPLICATIONS: None.   ESTIMATED BLOOD LOSS: 50 mL.   DESCRIPTION OF PROCEDURE: Clindamycin 600 mg was given intravenously prior to the procedure. General anesthesia is induced. The patient is placed on the fracture table and secured in the usual manner for left hip surgery. On the original films, the femoral head is displaced approximately 50% posteriorly. Reduction is performed as best possible by longitudinal traction and internal rotation of the hip. This is checked in the AP and lateral views and is seen to be satisfactory using the C-arm. The left hip is then thoroughly prepped with alcohol and ChloraPrep and draped in the standard sterile fashion. Under C-arm control, three 7.3 cannulated screws are inserted into the femoral head and neck and seen to be in good position using the C-arm. One of the screws is angulated posteriorly to ensure good stability. The wounds were thoroughly irrigated multiple times and closed with staples. A soft bulky dressing is applied. The patient is returned to the recovery room in satisfactory condition, having tolerated the procedure quite well.    ____________________________ Clare Gandyhristopher E. Paiton Boultinghouse, MD ces:gb D: 09/27/2012 11:34:51 ET T: 09/27/2012 21:49:19 ET JOB#: [id number]  cc: Clare Gandyhristopher E. Tiasha Helvie, MD, <Dictator> Clare GandyHRISTOPHER E Cliford Sequeira MD ELECTRONICALLY SIGNED 09/28/2012 13:58

## 2015-01-27 NOTE — Consult Note (Signed)
Brief Consult Note: Diagnosis: Pre-op ortho surgery, AFIB,CHF.   Patient was seen by consultant.   Consult note dictated.   Recommend to proceed with surgery or procedure.   Orders entered.   Discussed with Attending MD.   Comments: IMP Fx Ankle Pre-op surgery AFIB COPD CHF CRI Hyperlipidemia Obesity . Plan Continue current meds Acceptable surgical risk mild/mod risk I do not rec further studies I will follow with you post op.  Electronic Signatures: Dorothyann Pengallwood, Dwayne D (MD)  (Signed 18-Dec-13 07:51)  Authored: Brief Consult Note   Last Updated: 18-Dec-13 07:51 by Alwyn Peaallwood, Dwayne D (MD)

## 2015-01-27 NOTE — Consult Note (Signed)
PATIENT NAME:  Clayton Campbell, CURRENT MR#:  [id number] DATE OF BIRTH:  1924/08/20  DATE OF CONSULTATION:  09/25/2012  ADMITTING PHYSICIAN: Dr. Reita Chard CONSULTING PHYSICIAN:  Dr. Carney Corners. Sorren Vallier PRIMARY CARE PHYSICIAN: Dr. Dorothey Baseman and also [facility].   REASON FOR ADMISSION: Left femoral neck fracture status post fall.   REASON FOR CONSULTATION: Medical management.   HISTORY OF PRESENT ILLNESS: Clayton Campbell is an 81 year old pleasant Caucasian male with past medical history of chronic obstructive pulmonary disease, history of diastolic congestive heart failure. The last time admitted to this hospital was July 1st and discharged after 9 days and treated for acute respiratory distress secondary to diastolic congestive heart failure. The patient has multiple medical problems. He was doing relatively well at home; however, the patient states that he was ready to go to bed and while walking he fell forward, and this resulted in severe pain in the left hip area, and he has some contusion to the anterior chest and right shoulder. The patient was brought to the hospital for evaluation, and findings here was consistent with left femoral neck fracture. The patient was admitted to the orthopedic surgical services and medical consult was requested.   REVIEW OF SYSTEMS:  CONSTITUTIONAL: Denies any fever. No chills. No fatigue.  EYES: No blurring of vision. No double vision.  ENT: He has moderate hearing impairment. This is chronic for him. No sore throat. No dysphagia.  CARDIOVASCULAR: No chest pain other than the contusion to the chest and shoulder. No active shortness of breath, but he has chronic shortness of breath. He has edema of lower extremities. This is, again, chronic. No syncope or true loss of consciousness.  RESPIRATORY: No cough. No sputum production. No hemoptysis.  GASTROINTESTINAL: No abdominal pain. No vomiting. No diarrhea.  GENITOURINARY: No dysuria. No frequency of urination.   MUSCULOSKELETAL: Apart from the left hip pain and his chronic right shoulder pain, he has no other joint pain, no joint swelling. No muscular pain or swelling other than the left hip pain and tenderness.  INTEGUMENTARY: No skin rash or ulcers, but he has multiple subcutaneous hematomas.   NEUROLOGY: No focal weakness. No recent seizure activity. No headache.  PSYCHIATRY: No anxiety, but he has history of depression.  ENDOCRINE: No polyuria or polydipsia. No heat or cold intolerance.   PAST MEDICAL HISTORY: Again, last admission to this hospital was in July. He stayed about 9 days, treated for respiratory distress secondary to diastolic congestive heart failure. He had an echocardiogram at that time which showed ejection fraction of 55%. He had concentric left ventricular hypertrophy. The patient also has chronic obstructive pulmonary disease, seizure disorder, history of cerebrovascular accident, history also of cerebral arteriovenous malformation,  cerebral aneurysm, history of degenerative joint disease and osteoarthritis, systemic hypertension, hyperlipidemia, coronary artery disease, history of pulmonary embolus, history of rectal bleeding, also suspected to have obstructive sleep apnea. The patient has chronic atrial fibrillation.  PAST SURGICAL HISTORY: Right shoulder replacement, right hip surgery. He states the right hip surgery is secondary to fall, and the fracture was done at [facility] six months ago. The patient also has history of gout.   SOCIAL HABITS: Nonsmoker. He has a remote history of smoking just lightly every now and then, but that was only for a few years. No history of alcoholism.   SOCIAL HISTORY: He is married, living with his wife. 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   FAMILY HISTORY: His father died from a kind of blood  problem. His brother died of lung cancer. His mother died of unknown disease.   ADMISSION MEDICATIONS: Enalapril 5 mg once a day, allopurinol 100 mg twice  a day, Ambien 5 mg at bedtime, aspirin 81 mg a day, Plavix 75 mg a day, BuSpar 5 mg twice a day, Flonase 1 spray once a day, Lamictal 100 mg in the morning and at night as well, Lasix 40 mg twice a day, Lexapro 20 mg once a day, metoprolol 25 mg twice a day, multivitamin once a day, Percocet 5/325 q. 4 hours p.r.n., phenobarbital 97.2 mg twice a day, Proventil inhaler 2 puffs 4 times a day, simvastatin 80 mg once a day, Spiriva 1 inhalation once a day, Tylenol p.r.n., vitamin C 500 mg twice a day, zinc sulfate 220 mg once a day.   ALLERGIES: PENICILLIN AND ERYTHROMYCIN BASE.    PHYSICAL EXAMINATION: VITAL SIGNS: Blood pressure 104/56, respiratory rate 18, pulse 86, temperature 97.6, oxygen saturation 91%.  GENERAL APPEARANCE: Elderly male lying in bed in no acute distress.  HEAD, EARS, EYES, NOSE and THROAT: No pallor. No icterus. No cyanosis. Ears: Moderately decreased hearing bilaterally. No lesions. No discharge. Nasal mucosa was normal, no discharge, no bleeding. Oropharyngeal area showed no ulcers, no trauma, no oral thrush. He is edentulous and has dentures. Eyes: Normal eyelids and conjunctiva. Pupils were constricted, could not detect reactivity to light.  NECK: Supple. Trachea at midline. No thyromegaly. No cervical lymphadenopathy. No masses.  HEART: Regular S1, S2. No S3 or S4. There is a grade 3/6 systolic murmur at the left sternal border. No carotid bruits.  RESPIRATORY: Normal breathing pattern without use of accessory muscles. No rales. No wheezing.  ABDOMEN: Soft without tenderness. No hepatosplenomegaly. No masses.  MUSCULOSKELETAL: No joint swelling. No clubbing. He has pain of the left hip area.  SKIN: No ulcers. No subcutaneous nodules. He has subcutaneous hematomas.  NEUROLOGIC: Cranial nerves II through XII are intact. No focal motor deficit. Sensation is intact.  PSYCHIATRIC: The patient is alert and oriented x 3. Mood and affect were normal.   LABORATORY, DIAGNOSTIC AND  RADIOLOGICAL DATA:  His EKG showed atrial fibrillation with controlled ventricular rate at 73 per minute. Incomplete left bundle branch block. Poor progression of R waves. Nonspecific T wave abnormalities. CAT scan of the head showed no acute intracranial abnormality. CTA of the brain revealed evidence of the 9 x8 mm aneurysm at the anterior communicating artery. There is an AV malformation. The AV malformation is located at the left parietal area. CAT scan of the left hip showed acute left femoral neck fracture. There is heterogenous bony mineralization, may reflect metastatic disease, myeloma, or other infiltrative process cannot be excluded. Chest x-ray showed cardiomegaly, no effusion, no consolidation.   Serum glucose 88, BUN 59, creatinine 1.2, sodium 138, potassium 4.5. Total protein 7.7, albumin 3.1, alkaline phosphatase 156, AST 25, ALT 21. CBC showed white count of 6000, hemoglobin 11.7, hematocrit 36, platelet count 168.   ASSESSMENT AND PLAN: An 81 year old Caucasian male who suffered from acute left femoral neck fracture, status post fall. He has multiple medical problems including chronic atrial fibrillation, chronic obstructive pulmonary disease, congestive cardiomyopathy based on diastolic congestive heart failure, coronary artery disease, hypertension, hyperlipidemia, seizure disorder, history of stroke, cerebral aneurysm and AV malformation. The patient is at high risk for the surgery; however, one may consider doing it under spinal anesthesia in consultation with Cardiology; however, the cardiovascular risk remains the same. Since Dr. Dorothyann Peng had seen the patient  a few months ago and did evaluation, at one point there was mentioning of cardiac catheterization. That was deferred due to his renal insufficiency, and instead he ended by having echocardiogram. So, I recommend to reconsult Dr. Juliann Paresallwood for further input about his perioperative assessment, meanwhile can continue oxygen  supplementation. Continue the home medications as listed above. I will hold the Plavix but continue aspirin and may resume Plavix postoperatively. I will leave the issue of anticoagulation with the orthopedic team for his short-term treatment while he is in the hospital perioperatively.   CODE STATUS: The patient indicates to me that he does not like to have life support or extraordinary measures if he has a serious problem.   TIME SPENT EVALUATING THIS PATIENT: It took more than 1 hour and 20 minutes due to its complexity. I also reviewed his medical records.   ____________________________ Carney CornersAmir M. Rudene Rearwish, MD amd:cb D: 09/25/2012 06:05:49 ET T: 09/25/2012 19:02:59 ET JOB#: [id number]  cc: Carney CornersAmir M. Rudene Rearwish, MD, <Dictator> Zollie ScaleAMIR M Prentice Sackrider MD ELECTRONICALLY SIGNED 09/25/2012 22:38

## 2015-01-27 NOTE — Discharge Summary (Signed)
PATIENT NAME:  Clayton GraceDWARDS, Ramond P MR#:  [id number] DATE OF BIRTH:  [birth year]  DATE OF ADMISSION:  09/25/2012 DATE OF DISCHARGE:  10/02/2012  DISCHARGE DIAGNOSES:  1.  Displaced left femoral neck fracture.  2.  Congestive heart failure.  3.  Chronic obstructive pulmonary disease.  4.  Seizure disorder.  5.  History of cerebrovascular accident.  6.  Cerebral arteriovenous malformation.  7.  History of osteoarthritis.  8.  Systemic hypertension.  9.  Hyperlipidemia.  10. Coronary artery disease.  11. History of pulmonary embolism.  12. Chronic atrial fibrillation.   OPERATIONS/PROCEDURES PERFORMED: Reduction and internal fixation of left femoral neck fracture.   PAST MEDICAL HISTORY: As written on admission and as per the primary doctor consultation.   LABORATORY DATA: As noted in the chart.   COURSE IN THE HOSPITAL: Preoperative medical clearance was obtained by primary doctor, cardiology and neurology. The patient was taken to the operating room on 09/27/2012 where reduction and internal fixation of his femoral neck fracture was performed. Postoperatively, he remained stable and was maintained basically on the same medications. He did have difficulty mobilizing to the chair in physical therapy. His wound was examined and this was seen to be healing well. By 10/02/2012, it was felt that he could be safely discharged back to rehab. His orders are as on the printed order sheet. Please remove his staples in 10 days if his wounds are seen to be healing in quite well. He is to return to see Dr. Katrinka BlazingSmith in the office in 4 weeks for examination and x-ray.    ____________________________ Clare Gandyhristopher E. Enrika Aguado, MD ces:aw D: 10/02/2012 13:06:28 ET T: 10/02/2012 13:13:24 ET JOB#: [id number]  cc: Clare Gandyhristopher E. Aidee Latimore, MD, <Dictator> Clare GandyHRISTOPHER E Shantil Vallejo MD ELECTRONICALLY SIGNED 10/02/2012 15:06

## 2015-01-30 NOTE — Consult Note (Signed)
   Comments   I met with pt's daughter and granddaughter who are pt's HCPOA's. Updated daughter on pt's medical condition as she has not yet spoken with an MD. Family understands that pt is approaching the end of life. We discussed comfort care with transition to the [facility] and they are in agreement with this. Ginny Ward, RN, liason for the [facility] notified and will see pt.  Dx: metabolic encephalopathy  Secondary Dx: sepsis, CKD, COPD, CAD  Electronic Signatures: Joyous Gleghorn, Izora Gala (MD)  (Signed 20-Mar-14 14:20)  Authored: Palliative Care   Last Updated: 20-Mar-14 14:20 by Tamecca Artiga, Izora Gala (MD)

## 2015-01-30 NOTE — Discharge Summary (Signed)
PATIENT NAME:  Clayton Campbell, Clayton Campbell MR#:  [id number] DATE OF BIRTH:  1924/04/19  DATE OF ADMISSION:  12/24/2012 DATE OF DISCHARGE:  12/28/2012  ADMISSION DIAGNOSES: Sent from nursing home with multiple problems, mainly altered mental status, lethargy and abnormal labs with urinary tract infection.   DISCHARGE DIAGNOSES:  1.  Altered mental status due to metabolic encephalopathy secondary to sepsis.  2.  Acute on chronic renal failure.  3.  Cholelithiasis with acute cholecystitis.  4.  Septic shock.  5.  Urinary tract infection due to Escherichia coli extended spectrum beta-lactamase.    6.  Coronary artery disease.  7.  Congestive heart failure, diastolic dysfunction, compensated.  8.  Left perianal arteriovenous malformation and aneurysm, chronic.  9. Seizure disorder.  10.  Chronic obstructive pulmonary disease.     DISPOSITION: Hospice home.   MEDICATIONS AT DISCHARGE: Only comfort measures including his seizure prophylaxis, Depakote 500 mg twice daily, Norco 5/325 mg [name] pain, Zofran [name] nausea, Lorazepam 2.5 mg IV or IM as needed for discomfort, glycopyrrolate as needed for secretions and morphine 10 mg orally 1 to 2 hours as needed for comfort.   FOLLOWUP: With hospice home. Please also alert his primary care physician, Dr. Dorothey Basemanavid Bronstein.   HOSPITAL COURSE: The patient is an 81 year old gentleman who was brought to [facility]  by EMS due to changes in his mental status. He has history of CHF, coronary artery disease, COPD, seizure disorder, gout. He has been in the nursing home due to his increased confusion and multiple medical issues. The patient was very combative and at the nursing home he was found to be in acute kidney failure with a creatinine of 4.7. He had a urinalysis that showed signs of a urinary tract infection for which the patient was sent down to the ER. In the ER he was evaluated. He was pretty lethargic, minimally communicative. He had a CT scan of the  brain showing no acute changes. Urinalysis showed 10,000 white blood cells, 3+ leukocyte esterase. His creatinine here at the ER was 3.48 and his BNP 3100. He did not appear to be in acute CHF.   He was hypotensive with blood pressures in the 70s, requiring multiple IV boluses.  The patient was started on meropenem. His troponin was slightly elevated, likely due to demand ischemia. He received heparin prophylactically. The patient was admitted for treatment of this condition and as far as his medical problems the patient was further diagnosed with an ESBL infection in his UTI.  E. coli was mostly resistant to most of the antibiotics, sensitive to be imipenem and ertapenem for which the patient was treated with meropenem. His blood cultures did not show any growth.   The patient had a CT scan of the abdomen that showed significant cholelithiasis and air with distended gallbladder with stones, 11 mm bile duct measures. The patient was evaluated by surgery who, at that moment, thought that he was not going tobe a good candidate for any surgery. The patient was admitted to the floor and, unfortunately, did not improve, did not progress very well for what palliative care consult was obtained. After a couple of days of trying to figure out who was the power of attorney, we got that the biological granddaughter had the power of attorney.  She brought it over but overall everybody was agreeable that the patient needed to go to a hospice home.   As far his problems: 1.  Encephalopathy. The patient was admitted with altered mental  status. He was dehydrated. He was in acute kidney failure. Factors that were related to encephalopathy was the sepsis, possible high levels of phenobarbital, the ESBL UTI and possible cholelithiasis was playing a role in this as well. The patient cleared up a little bit but not sufficiently. The patient has ups and downs. This might be related to also some baseline dementia but overall the  patient did not improve significantly.  2.  As far as his acute kidney failure, the patient had worsening of his kidney disease and this was due to sepsis and also maybe dehydration. The patient developed ATN due to significant hypotension and shock. The patient improved some of his numbers but overall he his kidney function remained minimal and he was not  a candidate for hemodialysis or other treatments of this nature.  3.  Septic shock. The patient has significant hypotension requiring multiple boluses of fluids. It was due to his UTI, ESBL and cholecystitis/cholelithiasis.  4.  As far as coronary artery disease, the patient had an echo in July of 2013. He had an ejection fraction of 55%. He was on Plavix and aspirin, and those medications were discontinued after the patient went to hospice home.  5.  As far as his CHF, was not exacerbated.  6.  He has had a seizure disorder for what we are going to continue the Keppra. Phenobarbital was stopped, Lamictal was stopped.   Overall, the patient was discharged to hospice home with treatments related only for comfort care.    TIME SPENT: I spent about 40 minutes with this discharge on the discharge date.    ____________________________ Felipa Furnace, MD rsg:cs D: 12/30/2012 13:23:00 ET T: 12/30/2012 14:14:50 ET JOB#: [id number]  cc: Teena Irani. Terance Hart, MD Felipa Furnace, MD, <Dictator>   Zaydenn Balaguer Juanda Chance MD ELECTRONICALLY SIGNED 01/04/2013 13:37

## 2015-01-30 NOTE — Consult Note (Signed)
   Comments   I met with pt's wife and step-daughter who are pt's HCPOA's. Updated them on pt's status. They recognize that pt is not responding to medical therapy. I discussed comfort care with them and they are going to discuss it among themselves. Will follow up.  spent: 20 minutes  Electronic Signatures: Raekwan Spelman, Izora Gala (MD)  (Signed 19-Mar-14 20:55)  Authored: Palliative Care   Last Updated: 19-Mar-14 20:55 by Ozelle Brubacher, Izora Gala (MD)

## 2015-01-30 NOTE — Consult Note (Signed)
PATIENT NAME:  Clayton Campbell, Clayton Campbell MR#:  [id number] DATE OF BIRTH:  1923/11/13  DATE OF CONSULTATION:  09/25/2012  REFERRING PHYSICIAN:   Myra Rudehristopher Smith, MD  CONSULTING PHYSICIAN:  Leida Luton D. Shermaine Rivet, MD  INDICATION: Atrial fibrillation, shortness of breath, congestive heart failure.    REASON FOR CONSULTATION: The patient is preop for ankle surgery.   HISTORY OF PRESENT ILLNESS: Clayton Campbell is an 81 year old white male who [facility], known to me from a previous admission in July with respiratory distress, congestive heart failure, demand ischemia, leg edema, obstructive sleep apnea, acute on chronic renal insufficiency and atrial fibrillation, who states that he slipped and fell and injured his ankle and now presents for possible orthopedic surgery. He denies any significant cardiac history. He did not have any chest pain. He denies blacking out. He said he just lost his balance and slipped and fell.   REVIEW OF SYSTEMS: No blackout spells, no syncope. No nausea or vomiting. No fever, no chills, no sweats. No weight loss, no weight gain. No hemoptysis or hematemesis. No bright red blood per rectum. He does have a recent fall and leg swelling.   PAST MEDICAL HISTORY: Respiratory failure, congestive heart failure with diastolic dysfunction, borderline troponins in the past, some demand ischemia, lower leg edema, obstructive sleep apnea, acute on chronic renal insufficiency, atrial fibrillation, obesity, hyperlipidemia, chronic obstructive pulmonary disease, peripheral vascular disease.   ALLERGIES: He states none.   PHYSICAL EXAMINATION: VITAL SIGNS: Blood pressure 140/70, pulse of 85 and irregular, respiratory rate of 16, afebrile.  HEENT: Normocephalic, atraumatic. Pupils are equal and reactive to light.  NECK: Supple. No significant jugular venous distention, bruits, or adenopathy.  LUNGS: Bilateral rhonchi. No significant wheeze, rhonchi, or rales.  HEART: Irregularly irregular.  Systolic murmur at left sternal border.  ABDOMEN: Benign. Positive bowel sounds. No rebound, guarding, or tenderness.  EXTREMITIES: He has his ankle in traction from a fracture with swelling.  NEUROLOGIC: Grossly intact.  SKIN: Normal.   LABORATORY AND RADIOLOGICAL DATA:  Laboratories  show renal insufficiency, and elevated creatinine and BUN. CBC unremarkable. X-ray of his leg showed significant fracture. He has had multiple x-rays, CT of his head from the fall which are basically unremarkable.  EKG: Atrial fibrillation, nonspecific ST-T changes.   ASSESSMENT: 1. Preop for orthopedic surgery. 2. Atrial fibrillation.  3. Chronic obstructive pulmonary disease.  4. History of congestive heart failure with respiratory failure.  5. Renal insufficiency.  6. Edema.  7. Demand ischemia.   PLAN: Continue current medications. He should be an acceptable surgical risk. We will follow the patient. Continue inhalers. Continue medical therapy. I do not recommend any invasive or noninvasive pre-surgery evaluation to modify his risks.  Because I think he is an acceptable risk, I think continued medical therapy should be all that is necessary. I will continue to follow the patient with you.   ____________________________ Bobbie Stackwayne D. Juliann Paresallwood, MD ddc:cb D: 09/26/2012 07:44:02 ET T: 09/26/2012 10:27:22 ET JOB#: [id number]  cc: Cyncere Ruhe D. Juliann Paresallwood, MD, <Dictator> Alwyn PeaWAYNE D Anaiah Mcmannis MD ELECTRONICALLY SIGNED 10/25/2012 6:59

## 2015-01-30 NOTE — Discharge Summary (Signed)
PATIENT NAME:  Clayton Campbell, Clayton Campbell MR#:  [id number] DATE OF BIRTH:  1924/07/20  DATE OF ADMISSION:  12/24/2012 DATE OF DISCHARGE:  12/28/2012  ADMISSION DIAGNOSES: Sent from nursing home with multiple problems, mainly altered mental status, lethargy and abnormal labs with urinary tract infection.   DISCHARGE DIAGNOSES:  1.  Altered mental status due to metabolic encephalopathy secondary to sepsis.  2.  Acute on chronic renal failure.  3.  Cholelithiasis with acute cholecystitis.  4.  Septic shock.  5.  Urinary tract infection due to Escherichia coli extended spectrum beta-lactamase.    6.  Coronary artery disease.  7.  Congestive heart failure, diastolic dysfunction, compensated.  8.  Left perianal arteriovenous malformation and aneurysm, chronic.  9. Seizure disorder.  10.  Chronic obstructive pulmonary disease.     DISPOSITION: Hospice home.   MEDICATIONS AT DISCHARGE: Only comfort measures including his seizure prophylaxis, Depakote 500 mg twice daily, Norco 5/325 mg p.r.n. pain, Zofran p.r.n. nausea, Lorazepam 2.5 mg IV or IM as needed for discomfort, glycopyrrolate as needed for secretions and morphine 10 mg orally 1 to 2 hours as needed for comfort.   FOLLOWUP: With hospice home. Please also alert his primary care physician, Dr. Dorothey Baseman.   HOSPITAL COURSE: The patient is an 81 year old gentleman who was brought to [facility]  by EMS due to changes in his mental status. He has history of CHF, coronary artery disease, COPD, seizure disorder, gout. He has been in the nursing home due to his increased confusion and multiple medical issues. The patient was very combative and at the nursing home he was found to be in acute kidney failure with a creatinine of 4.7. He had a urinalysis that showed signs of a urinary tract infection for which the patient was sent down to the ER. In the ER he was evaluated. He was pretty lethargic, minimally communicative. He had a CT scan of the  brain showing no acute changes. Urinalysis showed 10,000 white blood cells, 3+ leukocyte esterase. His creatinine here at the ER was 3.48 and his BNP 3100. He did not appear to be in acute CHF.   He was hypotensive with blood pressures in the 70s, requiring multiple IV boluses.  The patient was started on meropenem. His troponin was slightly elevated, likely due to demand ischemia. He received heparin prophylactically. The patient was admitted for treatment of this condition and as far as his medical problems the patient was further diagnosed with an ESBL infection in his UTI.  E. coli was mostly resistant to most of the antibiotics, sensitive to be imipenem and ertapenem for which the patient was treated with meropenem. His blood cultures did not show any growth.   The patient had a CT scan of the abdomen that showed significant cholelithiasis and air with distended gallbladder with stones, 11 mm bile duct measures. The patient was evaluated by surgery who, at that moment, thought that he was not going to [facility]) <<MISSING TEXT>> for any surgery. The patient was admitted to the floor and, unfortunately, did not improve, did not progress very well for what palliative care consult was obtained. After a couple of days of trying to figure out who was the power of attorney, we got that the biological granddaughter had the power of attorney.  She brought it over but overall everybody was agreeable that the patient needed to go to a hospice home.   As far his problems: 1.  Encephalopathy. The patient was admitted with altered  mental status. He was dehydrated. He was in acute kidney failure. Factors that were related to encephalopathy was the sepsis, possible high levels of phenobarbital, the ESBL UTI and possible cholelithiasis was playing a role in this as well. The patient cleared up a little bit but not sufficiently. The patient has ups and downs. This might be related to also some baseline dementia  but overall the patient did not improve significantly.  2.  As far as his acute kidney failure, the patient had worsening of his kidney disease and this was due to sepsis and also maybe dehydration. The patient developed ATN due to significant hypotension and shock. The patient improved some of his numbers but overall he (Dictation Anomaly) <<MISSING TEXT>> a candidate for hemodialysis or other treatments of this nature.  3.  Septic shock. The patient has significant hypotension requiring multiple boluses of fluids. It was due to his UTI, ESBL and cholecystitis/cholelithiasis.  4.  As far as coronary artery disease, the patient had an echo in July of 2013. He had an ejection fraction of 55%. He was on Plavix and aspirin, and those medications were discontinued after the patient went to hospice home.  5.  As far as his CHF, was not exacerbated.  6.  He has had a seizure disorder for what we are going to continue the Keppra. Phenobarbital was stopped, Lamictal was stopped.   Overall, the patient was discharged to hospice home with treatments related only for comfort care.    TIME SPENT: I spent about 40 minutes with this discharge on the discharge date.      ____________________________ Felipa Furnaceoberto Sanchez Gutierrez, MD rsg:cs D: 12/30/2012 13:23:17 ET T: 12/30/2012 14:14:50 ET JOB#: [id number]  cc: Felipa Furnaceoberto Sanchez Gutierrez, MD, <Dictator> Teena Iraniavid M. Terance HartBronstein, MD

## 2015-01-30 NOTE — H&P (Signed)
PATIENT NAME:  Clayton Campbell, Clayton Campbell MR#:  [id number] DATE OF BIRTH:  1924/03/22  DATE OF ADMISSION:  12/24/2012  REFERRING PHYSICIAN: Dr. Lurline Hare.   PRIMARY CARE PHYSICIAN: Dr. Juluis Pitch.   CHIEF COMPLAINT: Sent from nursing home for multiple problems, mainly altered mental status, lethargy, abnormal labs, UTI.   HISTORY OF PRESENT ILLNESS: This is an 81 year old male who is a nursing home resident  with a past medical history significant for CHF, coronary artery disease, COPD, seizure disorder, gout, sent from the nursing home for lethargy and altered mental status and increased confusion. The patient was yelling, combative. The patient had recent labs done at the nursing home which did show the patient in acute renal failure with elevated creatinine from normal to 4.7. As well, the patient had positive urinalysis done then, where he was started yesterday on Levaquin, but the patient's condition continued to deteriorate which prompted them to bring him to the ER. The patient's baseline is awake, alert x 3. Currently is lethargic, minimally communicative. The patient had a CT of the brain done in the ED which did not show any acute finding, with no acute intracranial hemorrhage or mass effect. The patient's urinalysis did show 10,044 white blood cells, and the urine with +3 leukocyte esterase. His repeat creatinine today mildly improved from the nursing home value to 3.48. His proBNP was elevated at 3125, but once reviewed his previous values it appears at one point it was elevated, even double this amount at 6600. The patient does not appear to be in CHF. No leg edema. No crackles in the lungs. No JVD. The patient was hypotensive upon presentation with systolic blood pressure in the 70s, where he required multiple fluid boluses. The patient was started on meropenem. The patient was found to have an elevated phenobarbital level, as well elevated troponin at 0.12, but he did not have any EKG changes,  and there is no documentation of chest pain. The patient was given 324 of aspirin in the ED. Hospitalist Service was requested to admit the patient for the management of his medical problems.   PAST MEDICAL HISTORY:  1.  Seizure disorder since 17s.  2.  Gout.  3.  Chronic obstructive pulmonary disease.  4.  Erectile dysfunction.  5.  Status post lumbar as well as cervical fusion.  6.  Osteoarthritis.  7.  Congestive heart failure.  8.  Pulmonary embolism.  9.  Hypertension.  10.  Hyperlipidemia.  11. Coronary artery disease, status post stent placement.  12. Reflux disease.  13.  Rectal bleed in the past.  14. CVA.  15.  History of pressure ulcer on the lower back.  16.  Hemorrhoids.  17.  Questionable obstructive sleep apnea.  18. Worsening PFTs.   ALLERGIES: PENICILLIN AS WELL AS ERYTHROMYCIN.   FAMILY HISTORY: Father died of lung cancer.   SOCIAL HISTORY: The patient currently is a resident at the skilled nursing facility. There is no history of smoking. No history of alcohol abuse. He used to work as a Administrator.   HOME MEDICATIONS: 1.  Allopurinol 100 mg oral 2 times a day.  2.  Ambien 5 mg oral at bedtime.  3.  Aspirin 81 mg daily.  4.  Colace 100 mg oral 2 times a day.  5.  Depakote 200 mg oral 2 times a day.  6.  Enalapril 5 mg oral daily.  7.  Flonase 2 sprays daily.  8.  Haloperidol 1 mg oral every 12 hours  as needed.  9.  Klonopin 0.5 mg oral 2 times a day.  10.  Lamictal 100 mg oral 2 times a day.  11.  Lasix 40 mg oral 2 times a day.  12.  Levaquin 500 mg, total of 7 days, started yesterday.  13.  Metoprolol 25 mg oral 2 times a day.  14.  Milk of magnesia, as needed.  15.  Norco 5/325 every 6 hours as needed.  16.  Norco 5/325, 3 times a day, scheduled dose.  17.  Phenobarbital 97.2 mg oral every 12 hours.  18.  Plavix 75 mg oral daily.  19.  Protonix 40 mg oral 2 times a day.  20.  Proventil, as needed.  21.  Rena-Vite vitamin B complex, 1 tablet  oral daily.  23.  Simvastatin 80 mg oral daily. 24.  Spiriva 1 inhalation daily.  25.  Vitamin C 1000 mg oral daily.  26.  Voltaren topical, apply as needed.   REVIEW OF SYSTEMS: The patient is currently obtunded and confused. Cannot give any reliable review of systems.   PHYSICAL EXAM: VITAL SIGNS: Temperature 98.3, pulse 112, respiratory rate 22, blood pressure 97/58; was as low as 75/38.  GENERAL: Chronically ill-appearing male, elderly, lies in bed in no apparent distress.  HEENT: Head atraumatic, normocephalic. Pupils equal, round and reactive to light. No icterus. No conjunctivitis. No pharyngeal erythema. Dry oral mucosa with dry lips.  NECK: No masses. Supple. Nontender. No thyromegaly. Trachea is midline. No adenopathy could be appreciated in the neck, no JVD. No carotid bruits.  LUNGS: The patient has good air entry bilaterally without wheezing, rales, rhonchi.  CARDIOVASCULAR: S1, S2 heard. No rubs, murmur or gallops,  ABDOMEN: Soft, nontender, nondistended. Bowel sounds present.  RECTAL:  Deferred.  MUSCULOSKELETAL: Patient appears to be moving all extremities without significant deficits. No joint swelling.  SKIN: The patient had senile purpura on his upper extremities; delayed skin turgor. Warm and dry.  EXTREMITIES: No edema. No clubbing. No cyanosis. Pedal pulses +1 bilaterally.  NEUROLOGIC: Unable to evaluate secondary to patient's altered mental status.  PSYCHIATRIC: Unable to evaluate appropriately secondary to altered mental status.  LYMPHATIC: No adenopathy in the cervical region.   PERTINENT LABORATORIES: Glucose 91, proBNP 3125, BUN 141, creatinine 3.48, sodium 139, potassium 4.7, chloride 102, CO2 23, total protein 8.4, albumin 3.1, total bili 0.3, alk phos 142, AST 59, ALT 31. Troponin 0.12, phenobarbital 58  White blood cells 12.1, hemoglobin 11.8, hematocrit 36.7, platelets 223.   Urinalysis showing +3 leukocyte esterase, with 10,044 white blood cells in urine  and +3 bacteria.   PH 7.37, pCO2 of 38, pO2 94.   CT brain does not show any acute intracranial bleed or mass.   ASSESSMENT AND PLAN: This is an 81 year old male who is a nursing home resident, presents with altered mental status, urinary tract infection and acute renal failure.  1.  Encephalopathy/altered mental status: This appears to be multifactorial, but most likely related to metabolic encephalopathy due to his infection from urinary tract infection as well due to his acute renal failure with uremia with significantly elevated BUN. As well, the patient has elevated ammonia level which is contributing to this, as well the patient is on multiple medications causing altered mental status, mainly Norco, phenobarbital, Ambien, Klonopin. The patient's CT brain does not show any acute finding.  2.  Acute renal failure: This is most likely due to dehydration and depletion from his altered mental status and decreased p.o. intake. Unclear if  the patient has a urine obstruction or not, but he has 1000 mL urine output in the Foley bag after insertion of the Foley, but it was unclear how much urine output was immediately after the Foley was inserted. As well, the patient is on multiple medications contributing to renal failure, mainly large-dose Lasix and enalapril, so will continue with aggressive hydration, especially the patient was hypotensive. Will hold his Lasix and enalapril. Will obtain a kidney ultrasound and will consult Nephrology Service, and will check BMP daily.  3.  Urinary tract infection: Will start on meropenem. Will follow on the urine culture.  4.  Elevated troponins: This is most likely related to his renal failure, possibly demand ischemia from his hypotension upon presentation. Will give him 324 of aspirin, but will continue to cycle his cardiac enzymes and follow the trend and will have him on telemonitor, but this is unlikely due to acute coronary syndrome.  5.  Elevated ammonia level.  Will start the patient on lactulose.  6.  Elevated phenobarbital: This is most likely worsened due to his renal failure, as well as is most likely contributing to his altered mental status. Level is significantly elevated today so will hold today. Will recheck in a.m., and if within normal limits will resume at a lower dose, but will certainly avoid abrupt cessation of phenobarbital.  7.  History of seizure disorder: Will continue home medications, but continue to hold the phenobarbital until level is not toxic.  8.  Gout: Will resume allopurinol when more stable.  9.  History of chronic obstructive pulmonary disease: Does not appear to be an exacerbation. Will have him on p.r.n. nebulizers as well as Spiriva.  10.  Congestive heart failure, appears to of diastolic quality, not in exacerbation: Holding beta blockers and enalapril due to hypotension and renal failure. 11.  Hypertension: Blood pressure is on the lower side. Will hold all medications.  12.  Hyperlipidemia: Continue with statin.  13.  Coronary artery disease: Continue with aspirin and Plavix  14.  Deep vein thrombosis prophylaxis: Will start on subcutaneous heparin.  15.  History of atrial fibrillation: EKG currently showing atrial fibrillation, but is rate-controlled. We will resume back on metoprolol when blood pressure tolerates.  16.  Gastrointestinal prophylaxis on Protonix.  17.  Hyperlipidemia: Continue with statin.  18.  CODE STATUS: The patient has a DO NOT RESUSCITATE form from the nursing home, so will have patient on DO NOT RESUSCITATE. Tried to contact the patient's next of kin, his daughter. Left her a message.   Total time spent on admission and patient care: 65 minutes.    ____________________________ Albertine Patricia, MD dse:dm D: 12/24/2012 08:49:21 ET T: 12/24/2012 09:21:58 ET JOB#: [id number]  cc: Albertine Patricia, MD, <Dictator> Jaser Fullen Graciela Husbands MD ELECTRONICALLY SIGNED 12/25/2012 2:22

## 2015-01-30 NOTE — Consult Note (Signed)
PATIENT NAME:  Ned GraceDWARDS, Harshith P MR#:  [id number] DATE OF BIRTH:  May 22, 1924  DATE OF CONSULTATION:  12/24/2012  REQUESTING PHYSICIAN:  Dr. Randol KernElgergawy.  CONSULTING PHYSICIAN:  Tahje Borawski Lizabeth LeydenN. Jeryl Umholtz, MD  REASON FOR CONSULTATION: Acute renal failure.   HISTORY OF PRESENT ILLNESS: The patient is an 81 year old Caucasian male with a past medical history of seizure disorder, gout, COPD, erectile dysfunction, lumbar and cervical fusion, osteoarthritis, congestive heart failure, history of pulmonary embolism, hypertension, hyperlipidemia, coronary artery disease status post stent placement, GERD, history of CVA who presented to the [facility] with altered mental status, UTI, and acute renal failure. Apparently, the patient was found to be yelling and combative at the nursing home. He is unable to offer any history at this point in time. The patient from a baseline creatinine is noted to be 1.04 on 09/30/2012. Upon presentation, his creatinine was found to be 4.27. With hydration, creatinine is down to now 3.48. He also appears to have a significant urinary tract infection with an E. coli which appears to be resistant to a number of antibiotics. The patient is currently on meropenem. He has a Foley catheter in place and is making urine. He has made  650 mL of urine thus far today. Urinalysis was also performed today which showed greater than 10,000 WBCs per high-power field, RBCs of 102 per high-power field, and 3+ bacteria. He also had a renal ultrasound performed which was negative for obstruction. CT head was also negative for any acute findings.   PAST MEDICAL HISTORY: 1. Hypertension.  2. Hyperlipidemia.  3. Coronary artery disease, status post stent placement.  4. GERD.  5. History of CVA.  6. Seizure disorder.  7.  Gout.  8.  COPD.  9.  Erectile dysfunction.  10.  Lumbar and cervical fusion.  11. Osteoarthritis.  12. Congestive heart failure, likely diastolic in nature, with  underlying LVH.  13. History of pulmonary embolism.   ALLERGIES: ERYTHROMYCIN AS WELL AS PENICILLIN.   FAMILY HISTORY: Unable to obtain directly from the patient; however, per dictated history and physical, it appears that his father had a history of lung cancer.   SOCIAL HISTORY: Unable to directly obtain from the patient; however, per dictated history and physical, it appears that he is a resident at a skilled nursing facility.   REVIEW OF SYSTEMS: Unable to obtain from the patient directly as he currently has altered mental status.   CURRENT INPATIENT MEDICATIONS: Include 0.9 normal saline at 100 mL/h, aspirin 81 mg daily, Plavix 75 mg daily, Depakote 500 mg b.i.d., Haldol 1 mg p.o. q.12 hours p.r.n., heparin 5000 units subcutaneous q.12 hours, lactulose 30 mL p.o. b.i.d., Lamictal 100 mg b.i.d., meropenem 500 mg IV q. 24 hours, Zofran 4 mg q.6 hours p.r.n. nausea, Protonix 40 mg b.i.d.,  Zoloft 100 mg daily, tiotropium 1 capsule inhaled daily.   PHYSICAL EXAMINATION: VITAL SIGNS: Temperature 99, pulse 114, respirations 18, blood pressure 102/59, but has been as low as 93/52.  GENERAL: Reveals an elderly male who yells out at times during the interview.  HEENT: Normocephalic, atraumatic. Extraocular movements are present. Pupils equal, round, and reactive to light. No scleral icterus. Conjunctivae are pink. No epistaxis noted. Difficult to assess his hearing. Oral mucosa are very dry.  NECK: Supple, without JVD or lymphadenopathy.  LUNGS: Show scattered rhonchi, but normal respiratory effort.  HEART: S1, S2. Regular in rhythm. The patient is slightly tachycardic. No murmurs, rubs are  appreciated.  ABDOMEN: Soft, nontender, nondistended.  Bowel sounds positive. No rebound or guarding. No gross organomegaly appreciated.  EXTREMITIES: No clubbing, cyanosis, or edema.  NEUROLOGIC: The patient appears to be spontaneously moving both upper extremities. He was also yelling out during the interview;  however, he did not consistently follow commands. Foley catheter noted to be in place. No suprapubic tenderness noted at this time.  MUSCULOSKELETAL: No joint redness, swelling or tenderness appreciated.  SKIN: Warm and dry. Bilateral upper extremity ecchymosis noted.  PSYCHIATRIC: The patient yelling out during the interview. He has very limited insight into his current illness.   LABORATORY DATA: Serum sodium is 139, potassium 4.7, chloride 102, CO2 of 23, BUN 141, creatinine 3.48 glucose 91, total protein 8.4, albumin 3.1, direct bilirubin 0.3, alkaline phosphatase 142, AST 59, ALT 31, CK 272, CK-MB 8.8, troponin 0.13, phenobarbital 58. CBC shows WBCs 12.1, hemoglobin 11.8, hematocrit 36, platelets 223. Urine sodium of 30. Urine culture shows greater than 100,000 colony-forming units of E. coli, which appears to be ESBL. Urinalysis shows urine protein of 100 mg/dL; 161 RBCs per high-power field; 10,044 WBCs per high-power field. Renal ultrasound shows no evidence of obstruction in either kidney. CT head shows no acute intracranial process. ABG shows pH of 7.37, pCO2 of 38, pO2 of 94, ammonia of 55.   IMPRESSION: This is an 81 year old Caucasian male with a past medical history of hypertension, hyperlipidemia, coronary artery disease, reflux disease, seizure disorder, gout, chronic obstructive pulmonary disease, erectile dysfunction, lumbar and cervical spinal fusion, osteoarthritis, congestive heart failure with diastolic dysfunction, pulmonary embolism, who presented to [facility] with altered mental status, abnormal labs, and urinary tract infection.  1.  Acute renal failure: The patient's baseline creatinine appears to be 1.04 from 09/30/2012, with an estimated glomerular filtration rate greater than 60. Creatinine upon presentation was 4.27 and is now down to 3.48. Most likely etiology of his underlying acute renal failure is acute tubular necrosis, likely from concurrent illness  and urinary tract infection. Continue intravenous fluid hydration at this point in time. The patient is making urine. No indication for dialysis at this point in time. Certainly avoid nephrotoxins, including intravenous contrast and nonsteroidal anti-inflammatory drugs. Enalapril and diuretics have been appropriately held. Renal ultrasound was negative for obstruction. We will also check SPEP and UPEP.  2.  Urinary tract infection: The patient appears to have extended-spectrum beta lactamase Escherichia coli. The patient is appropriately maintained on meropenem. Continue this for now.  3.  Altered mental status: Likely due to underlying urinary tract infection. Uremia may also be playing some role, as BUN is greater than 100. We would recommend continued medical therapy for now; however, if altered mental status persists, we may need to consider a trial of dialysis, but would recommend holding off at this point in time. Serum ammonia level is also high at 55, and the patient also had a slightly elevated phenobarbital level, which may be contributing to his mental status now.  4.  Anemia, not otherwise specified: The patient appears to have mild anemia with a hemoglobin of 11.8. MCV is noted as being 97. We will check SPEP and UPEP for further evaluation.  5.  I would like to thank Dr. Randol Kern for this kind referral. Further plans based on presentation.      ____________________________ Lennox Pippins, MD mnl:dm D: 12/24/2012 15:16:00 ET T: 12/24/2012 15:40:32 ET JOB#: [id number]  cc: Lennox Pippins, MD, <Dictator> Starleen Arms, MD Lennox Pippins MD ELECTRONICALLY SIGNED 01/11/2013 11:32

## 2015-01-30 NOTE — Consult Note (Signed)
PATIENT NAME:  Clayton Campbell, Clayton Campbell MR#:  [id number] DATE OF BIRTH:  1924/05/09  DATE OF CONSULTATION:  12/24/2012  REFERRING PHYSICIAN:   CONSULTING PHYSICIAN:  Carmie Endalph L. Ely III, MD  PRIMARY CARE PHYSICIAN: Dorothey Basemanavid Bronstein, MD   ADMITTING PHYSICIAN: Prime Doc   CHIEF COMPLAINT: Confusion, altered mental status.   BRIEF HISTORY: [name] is an 81 year old nursing home resident admitted with severe confusion, lethargy and altered mental status. He was known to have some underlying renal insufficiency with evidence for urosepsis. He was placed on outpatient antibiotics, but his condition deteriorated and he was admitted to the hospital for further evaluation. His laboratory values in the hospital demonstrated a slightly elevated white blood cell count at 12,100.  Liver function studies were slightly elevated with an alkaline phosphatase of 142 and SGOT of 59. Lipase was normal. Troponins were slightly elevated. His ammonia was elevated at 55. Abdominal ultrasound was performed which demonstrated possible emphysematous gallbladder disease with stones identified on that examination. There was mild gallbladder wall thickening. Some of this suggested possible mural gas consistent with emphysematous gallbladder change. CT scan was performed which demonstrated a markedly distended gallbladder with multiple stones, but no evidence of any air in the gallbladder wall or evidence of pericholecystic fluid. With this history, the surgical service was consulted to evaluate for possible biliary tract disease.  His review of systems is absolutely impossible with his current level of confusion. He does have a history on the chart of chronic obstructive lung disease, congestive heart failure, coronary artery disease status post stent, hypertension and hyperlipidemia. I do not have any surgical history. He does not have any surgical scars on his abdomen. His medications are outlined in his admission note. He does have a  history of being on Plavix for his stent placement and with aspirin in addition.   PHYSICAL EXAMINATION: GENERAL: He is combative complaining, calling out. He has a Comptrollersitter with him.  VITAL SIGNS: Temperature is 98.9, blood pressure is 134/74, heart rate is 112 and regular and oxygen saturation is 95% on 2 liters.  HEENT: No scleral icterus. No facial deformities. No pupillary abnormality.  NECK: Supple, nontender with no adenopathy and midline trachea.  CHEST: Clear with no adventitious sounds and normal pulmonary excursion.  CARDIAC: No murmurs or gallops to my ear and seems to be in normal sinus rhythm.  ABDOMEN: Mildly distended but soft with no point tenderness. He has multiple abdominal bruising. He has no rebound, no guarding.  EXTREMITIES: Full range of motion. No deformities. Mild edema distally.  PSYCHIATRIC: Complete disorientation and inappropriate affect.   IMPRESSION AND RECOMMENDATIONS: I have independently reviewed his CT scan. He certainly does have gallstones with a distended gallbladder, but no evidence of significant emphysematous gallbladder disease.  There does not appear to be any evidence of pericholecystic fluid to suggest acute cholecystitis. Liver function studies are only mildly elevated. With his Escherichia coli sepsis and his renal insufficiency and urinary changes I would suspect urosepsis is the most likely diagnosis. Because of his Plavix and aspirin, he would not be a good surgical candidate so I would recommend nonoperative intervention at this time. We would maintain him on antibiotic therapy and will continue to follow him while he is hospitalized.  ____________________________ Carmie Endalph L. Ely III, MD rle:sb D: 12/24/2012 12:10:09 ET T: 12/24/2012 12:36:58 ET JOB#: [id number]  cc: Carmie Endalph L. Ely III, MD, <Dictator> Teena Iraniavid M. Terance HartBronstein, MD Quentin OreALPH L ELY MD ELECTRONICALLY SIGNED 12/24/2012 18:19

## 2015-01-30 NOTE — Consult Note (Signed)
   Comments   Called by RN to come speak with family again. Family now concerned that pt is hungry and ask about putting a "feeding tube" in now. Pt is intermittently alert so I suggested family try bites of ice cream to see if he tolerates it. As pt is comfort care, this seems appropriate.   Electronic Signatures: Donis Kotowski, Harriett SineNancy (MD)  (Signed 20-Mar-14 19:48)  Authored: Palliative Care   Last Updated: 20-Mar-14 19:48 by Autie Vasudevan, Harriett SineNancy (MD)

## 2015-02-01 NOTE — Discharge Summary (Signed)
PATIENT NAME:  Clayton Campbell, Aldwin P MR#:  [id number] DATE OF BIRTH:  1924-03-01  DATE OF ADMISSION:  04/09/2012 DATE OF DISCHARGE:  04/17/2012  ADMISSION DIAGNOSES:  1. Acute respiratory distress due to acute on chronic congestive heart failure, diastolic in nature.  2. Mildly elevated troponin with chest pain supply/demand ischemia.  3. Leg edema with significant weight gain, improving.  4. Suspected obstructive sleep apnea.  5. Acute on chronic kidney disease.  6. History of atrial fibrillation.   CONSULTS:  1. Dr. Jennet MaduroPucilowska.  2. Dr. Juliann Paresallwood.   LABORATORY, DIAGNOSTIC AND RADIOLOGICAL DATA: Discharge sodium 144, potassium 3.4, chloride 105, bicarbonate 33, BUN 40, creatinine 1.52, glucose 109. Blood cultures are negative to date. 2-D echocardiogram showed ejection fraction of 55%, right ventricular systolic function is normal, left ventricular systolic function is normal. There is moderate concentric left ventricular hypertrophy.   HOSPITAL COURSE: 81 year old male who presented with shortness of breath, was found to have acute on chronic respiratory failure from acute on chronic diastolic heart failure. For further details, please refer to the history and physical.  1. Acute respiratory distress due to acute on chronic diastolic heart failure. Patient presented with increasing shortness of breath, leg edema and elevated BNP. His echocardiogram showed ejection fraction of 55%, however, he does have concentric left ventricular hypertrophy. His lung V/Q scan was low probability for pulmonary emboli. He was initially hypoxic. He was diuresed with IV Lasix which really helped. He is back on his p.o. He has diuresed well. He is currently euvolemic.  2. Mildly elevated troponin of 0.06, second troponin was normal. Likely due to his renal insufficiency more than to demand ischemia. Dr. Juliann Paresallwood was consulted. No further intervention at this time.  3. Lower leg edema secondary to diastolic heart  failure, which has improved.  4. Suspected nocturnal oximetry on room. Nocturnal oximetry on room air as an outpatient is needed versus a sleep study.   5. Acute on chronic kidney disease stage II. Patient's creatinine remained stable.  6. History of atrial fibrillation. Rate was well controlled on metoprolol, aspirin and Plavix.   DISCHARGE PLANNING: Patient was adamant about being discharged home. We did obtain a psych consult with Dr. Jennet MaduroPucilowska for medical competency and patient is medically capable of making his own decisions about disposition.   DISCHARGE MEDICATIONS:  1. Metoprolol 25 b.i.d.  2. Buspirone 5 mg b.i.d.  3. Enalapril 5 mg daily.  4. Proventil HFA 2 puffs q.i.d. p.r.n.  5. Hydrocortisone 1 suppository rectally b.i.d.  6. Citalopram 20 mg daily.  7. Allopurinol 100 mg b.i.d.  8. Phenobarbital 97.2, 2 tablets at bedtime.  9. Acetaminophen hydrocodone 325/10 q.i.d. p.r.n.  10. Simvastatin 80 mg daily.  11. Plendil 10 mg daily. 12. Plavix 75 mg daily.  13. Fluticasone 50 mcg p.r.n.  14. Lamotrigine 100 mg b.i.d.  15. Lasix 40 mg b.i.d.  16. Aspirin 81 mg daily.  17. Spiriva 18 mcg daily.  18. Nitrostat sublingual p.r.n. chest pain.  19. Triamcinolone 0.1% affected area p.r.n.   DISCHARGE DIET: Low sodium.   DISCHARGE ACTIVITY: As tolerated.   DISCHARGE REFERRAL: Home health physical therapy.   [facility]    TIME SPENT: 35 minutes.   ____________________________ Janyth ContesSital P. Juliene PinaMody, MD spm:cms D: 04/17/2012 10:52:51 ET T: 04/17/2012 12:19:03 ET JOB#: [id number]  cc: Abel Ra P. Juliene PinaMody, MD, <Dictator> [facility]  Janyth ContesSITAL P Somnang Mahan MD ELECTRONICALLY SIGNED 04/17/2012 13:34

## 2015-02-01 NOTE — Consult Note (Signed)
Brief Consult Note: Diagnosis: Mood disorder NOS.   Patient was seen by consultant.   Consult note dictated.   Recommend further assessment or treatment.   Comments: [name] has a h/o depression stable on a combination of Celexa, BuSpar, Trazodone and Ambien. He was admitted for exacerbation of CAD. Attending physician recommends SNF but the patient insists to return to home even though his wife will not be staying with him and he will have to depend on help from his kids and grandkids.  PLAN: 1. The patient does have the capacity to decide about disposition.   2. Please continue all medications for depression.  3. I will sigh off..  Electronic Signatures: Kristine LineaPucilowska, Kista Robb (MD)  (Signed 08-Jul-13 17:22)  Authored: Brief Consult Note   Last Updated: 08-Jul-13 17:22 by Kristine LineaPucilowska, Joanny Dupree (MD)

## 2015-02-01 NOTE — Consult Note (Signed)
PATIENT NAME:  Ned GraceDWARDS, Josian P MR#:  [id number] DATE OF BIRTH:  [birth year]  DATE OF CONSULTATION:  04/10/2012  REFERRING PHYSICIAN:  Katharina Caperima Vaickute, MD CONSULTING PHYSICIAN:  Dymon Summerhill D. Damarrion Mimbs, MD  INDICATION: Congestive heart failure, shortness of breath, and angina.   HISTORY OF PRESENT ILLNESS: [name] is an 81 year old white male with multiple medical problems including coronary artery disease, congestive heart failure, angina, gout, pulmonary embolus in the past, hyperlipidemia, and chronic obstructive pulmonary disease who presents with shortness of breath. He was doing well until about 3 or 4 days ago when he started having worsening shortness of breath and tightness in the abdomen as well as chest. He has been coughing and having phlegm production. He denies fever or chills. He has had some chest pain. He has had shortness of breath and dyspnea. She denies any wheezing. He has some lower extremity swelling and because of the worsening symptoms of chest pain and shortness of breath with cough he was admitted for further evaluation of possible heart failure.   REVIEW OF SYSTEMS: No blackout spells or syncope. Denies nausea and vomiting. Denies fever, chills, or sweats. No weight loss. No weight gain, hemoptysis, or hematemesis. No bright red blood per rectum.   PAST MEDICAL HISTORY:  1. Seizure disorder. 2. Gout. 3. Chronic obstructive pulmonary disease. 4. Erectile dysfunction.  5. Degenerative joint disease.  6. Osteoarthritis.  7. Congestive heart failure. 8. Pulmonary embolus. 9. Hypertension. 10. Hyperlipidemia. 11. Coronary artery disease.  12. Reflux.  13. Rectal bleeding. 14. Cerebrovascular accident.  15. Hemorrhoids.  16. Sleep apnea   MEDICATIONS:  1. Steroid cream. 2. Nitrostat p.r.n.  3. Aspirin 81 mg a day.  4. Lasix 40 mg twice a day. 5. Spiriva 18 mcg daily.  6. Metoprolol 25 mg twice a day.  7. Buspirone 5 mg twice a day.  8. Enalapril 5 mg a day.   9. Proventil 2 puffs four times daily. 10. Hydrocortisone 25 mg suppository twice a day. 11. Citalopram 20 mg a day.  12. Allopurinol 100 mg twice a day.  13. Phenobarbital 200 mg at bedtime.  14. Hydrocodone/APAP p.r.n.  15. Simvastatin 80 mg a day.  16. Plendil 10 mg a day.  17. Plavix 75 mg a day. 18. Fluticasone two puffs nasally daily. 19. Lamotrigine 50 mg p.o. twice a day.  DRUG ALLERGIES: Penicillin and erythromycin.   FAMILY HISTORY: Lung cancer and hypertension.  SOCIAL HISTORY: He lives with his wife. Long time smoker. Used to be a Naval architecttruck driver. Denies alcohol consumption. Retired.   PHYSICAL EXAMINATION:   VITAL SIGNS: Blood pressure 130/60, pulse 80, respiratory rate 20, and afebrile.   HEENT: Normocephalic, atraumatic. Pupils are equally round and reactive to light.   NECK: Supple. No significant bruits.   LUNGS: Bilateral rhonchi, faint rales. No wheezing.   HEART: Regular rate and rhythm. Systolic ejection murmur at the left sternal border.   ABDOMEN: Benign.   EXTREMITIES: 2+ edema.   NEUROLOGICAL: Grossly intact.   SKIN: Normal.   LABORATORY, DIAGNOSTIC AND RADIOLOGIC DATA: Glucose 116, BUN and creatinine 65 and 1.76, sodium 134, and potassium unremarkable. BNP 6654. LFTs were normal. Cardiac enzymes negative. White count 6.9, hemoglobin 11.5, and platelet count 187. D-dimer 1.54.   Urinalysis is basically negative.   Chest x-ray showed bilateral atelectasis.   EKG: Atrial fibrillation at 75, nonspecific ST-T wave changes.   ASSESSMENT:  1. Congestive heart failure. 2. Chronic obstructive pulmonary disease. 3. Shortness of breath. 4. Coronary artery disease.  5. Angina. 6. Renal insufficiency.  7. Hypertension.  8. Atrial fibrillation.  9. Seizure disorder. 10. History of pulmonary embolus.   PLAN: Agree with current therapy. Recommend evaluation. Continue telemetry. Continue blood pressure control. Continue diuretic and inhaler therapy.  We will consider further evaluation. Cardiac wise may need echocardiogram. We will hold off on cardiac catheterization for now because of renal insufficiency. Continue current therapy for persistent shortness of breath and heart failure. Continue short-term anticoagulation for now. He is probably a poor long-term anticoagulation candidate because of fall. Lasix therapy as necessary. Continue pain control. Continue statin therapy. Peripheral vascular disease therapy with Plendil and Plavix. We will continue therapy and see if the patient improves.  ____________________________ Bobbie Stack Juliann Pares, MD ddc:slb D: 04/11/2012 14:33:00 ET T: 04/11/2012 15:05:25 ET JOB#: [id number]  cc: Belita Warsame D. Juliann Pares, MD, <Dictator> Alwyn Pea MD ELECTRONICALLY SIGNED 05/01/2012 9:13

## 2015-02-01 NOTE — Consult Note (Signed)
PATIENT NAME:  Clayton Campbell, Clayton Campbell MR#:  [id number] DATE OF BIRTH:  03-May-1924  DATE OF CONSULTATION:  04/16/2012  REFERRING PHYSICIAN:  Katharina Caper, MD  CONSULTING PHYSICIAN:  Kadynce Bonds B. Armenta Erskin, MD  REASON FOR CONSULTATION: To evaluate the capacity to make medical decision about disposition.   IDENTIFYING DATA: Clayton Campbell is an 81 year old male with history of depression.   CHIEF COMPLAINT: "I want to make my own decisions".  HISTORY OF PRESENT ILLNESS: Clayton Campbell has a history of depression. He has been in care of his doctors at [facility] and has been stable on a combination of Celexa, BuSpar, Ambien, and trazodone. He was admitted to the medical floor for worsening of congestive heart failure. He spent a week in the hospital. At the time of discharge it became clear that his wife of three years is unable or unwilling to take care of him and she is going to move out of the house. The patient would be home alone and there is a feeling that he is unable to take care of himself. He was offered transfer to a skilled nursing facility for further rehabilitation but the patient denies. He is not depressed, anxious, or psychotic. There is no cognitive decline. The patient is in good mental shape. He is used to making his own decisions. He does not believe that he will be abandoned. He feels that it is okay if the wife moves temporarily or permanently with the grandson but he is absolutely certain that he can count on his children and grandchildren for help. If needed, he could hire someone. He understands pros and cons of his decision but is unwilling to go to a nursing facility. He denies any thoughts of hurting himself or others. He actually sounds good natured given his situation. He feels that he has been always a good provider for all his family and believes that his kids love him deeply and will take care of him. The wife of three years has been less supportive. She has not been contributing  to everyday expenses, uses her own money the way she pleases. The patient signed healthcare power-of-attorney to her and possibly her two daughters when he was admitted to the hospital and is uncertain what was going to happen to him now. He has second thoughts and would like to take it back. He wants to get in touch with a lawyer. He denies alcohol, illicit substance or prescription drug abuse.   PAST PSYCHIATRIC HISTORY: None except for treatment of depression by his primary provider. No hospitalizations. No suicide attempts.   FAMILY PSYCHIATRIC HISTORY: None reported.   PAST MEDICAL HISTORY:  1. Seizure disorder since the 26's.  2. Gout. 3. Chronic obstructive pulmonary disease.  4. Osteoarthritis.  5. Congestive heart failure. 6. History of pulmonary embolism.  7. Hypertension.  8. Dyslipidemia. 9. Coronary artery disease, status post stent placement. 10. Reflux disease.   MEDICATIONS ON ADMISSION:  1. Triamcinolone 0.1% cream as needed.  2. Nitrostat 0.4 mg as needed.  3. Aspirin 81 mg daily.  4. Lasix 80 mg twice daily.  5. Spiriva 18 mg daily.  6. Metoprolol 25 mg twice daily.  7. BuSpar 5 mg twice daily.  8. Enalapril 5 mg daily.  9. Proventil 2 puffs 4 times daily as needed.  10. Hydrocortisone 25 mg suppository twice daily.  11. Citalopram 20 mg daily.  12. Allopurinol 100 mg twice daily.  13. Phenobarbital 200 mg at bedtime. 14. Hydrocodone/APAP 10/325 four times daily  as needed.  15. Simvastatin 80 mg daily.  16. Plendil 10 mg daily.  17. Plavix 75 mg daily.  18. Fluticasone 2 puffs to each nostril daily as needed. 19. Lamictal 50 or 100 mg twice daily.   ALLERGIES: Penicillin and erythromycin.   SOCIAL HISTORY: He has been married twice. He has children from his first marriage, three of them, and several grandchildren. He got married again three years ago. He loves his wife but wants autonomy and she sounds like a nagging wife that wants to tell him what to do.  He used to work as a Naval architect. He owns a house,  He states "I bought my wife a house". No financial problems. He is not a smoker. He does not drink or use drugs.   REVIEW OF SYSTEMS: CONSTITUTIONAL: No fevers or chills. Positive for fatigue and possibly gradual weight gain from water retention. EYES: No double or blurred vision. ENT: No hearing loss. RESPIRATORY: Occasional shortness of breath. CARDIOVASCULAR: No chest pain or orthopnea. GASTROINTESTINAL: No abdominal pain, nausea, vomiting, or diarrhea. GU: No incontinence. Intermittent dysuria. ENDOCRINE: No heat or cold intolerance. LYMPHATIC: No anemia or easy bruising. INTEGUMENTARY: No acne or rash. MUSCULOSKELETAL: No muscle or joint pain. NEUROLOGIC: No tingling or weakness. PSYCHIATRIC: See history of present illness for details.   PHYSICAL EXAMINATION:   VITAL SIGNS: Blood pressure 120/67, pulse 78, respirations 18, temperature 98.5.   GENERAL: This is an obese male sitting in chair in no acute distress, chatty and pleasant. The rest of the physical examination is deferred to his primary attending.   LABORATORY DATA: Chemistries blood glucose 109, BUN 4, creatinine 1.52, sodium 144, potassium 3.4. Cardiac enzymes negative x2. CBC white blood count 10.9, hemoglobin 11.2, hematocrit 34.5, platelets 185. Urine culture negative. Urinalysis is not suggestive of urinary tract infection   EKG atrial fibrillation, anteroseptal infarct of undetermined age.    MENTAL STATUS EXAMINATION: The patient is alert and oriented to person, place, time, and situation. He is pleasant, polite, and cooperative, rather chatty, engaging, and funny. Excellent memory. Good historian. He is wearing a hospital gown. He maintains good eye contact. His speech is of normal rhythm, rate, and volume. Mood is fine with full affect. Thought processing is logical and goal oriented. Thought content he denies suicidal or homicidal ideations. There are no delusions or paranoia.  There are no auditory or visual hallucinations. His cognition is grossly intact. He registers 3 out of 3 and recalls 3 out of 3 objects after five minutes. He can spell world forward and backward. He knows the current president. His insight and judgment are fair.   SUICIDE RISK ASSESSMENT: This is a patient with no history of dangerous behaviors, stable on antidepressant, who was admitted to the hospital for medical reasons and who refuses to go to skilled nursing facility following discharge.   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   AXIS II: Deferred.   AXIS III: Multiple medical problems. See above.   AXIS IV: Chronic mental and physical illness, marital conflict, decline of general health, loss of way of life.   AXIS V: GAF 45.   PLAN:  1. The patient does have the capacity to make decisions about his disposition.   2. Please continue all medications as prescribed by his primary doctor.  3. I will sign off.   ____________________________ Ellin Goodie. Jennet Maduro, MD jbp:drc D: 04/16/2012 17:21:39 ET T: 04/17/2012 09:03:41 ET JOB#: [id number]  cc: Dylanie Quesenberry B. Jennet Maduro, MD, <  Dictator> Shari ProwsJOLANTA B Armina Galloway MD ELECTRONICALLY SIGNED 04/27/2012 6:05

## 2015-02-01 NOTE — H&P (Signed)
PATIENT NAME:  Clayton Campbell, Clayton Campbell MR#:  [id number] DATE OF BIRTH:  Nov 27, 1923  DATE OF ADMISSION:  04/09/2012  [facility]    HISTORY OF PRESENT ILLNESS: The patient is an 81 year old Caucasian male with past medical history significant for history of congestive heart failure, coronary artery disease, and history of COPD who presented to the hospital with complaints of shortness of breath. According to the patient, he was doing well up until approximately three or four days ago when he started having shortness of breath as well as tightness in his abdomen as well as chest. He has been coughing and having some phlegm production. He denies any fevers or chills, however admits of having some pains in his chest area. He has been also having shortness of breath which is worsening at rest. He denies any wheezing. He admits of pain in the sternal area. He admits also of lower extremity swelling. Admits of arm as well as leg and abdominal swelling. He admits of pain in his midsternal area which increases with deep breathing. Pain is described as brick on the chest with no radiation, constant, uncomfortable discomfort accompanied by shortness of breath, also fullness in his abdomen. Because of this discomfort, the patient came to the hospital for further evaluation. In the hospital he was noted to have lower extremity swelling and hospitalist services were contacted for admission.   PAST MEDICAL HISTORY: History of multiple medical problems including: 1. Seizure disorder since 1980's. 2. Gout.  3. History of chronic obstructive pulmonary disease. 4. History of erectile dysfunction. 5. Status post lumbar as well as cervical fusion.  6. Osteoarthritis. 7. Congestive heart failure. 8. Pulmonary embolism. 9. Hypertension. 10. Hyperlipidemia. 11. Coronary artery disease, status post stent placement. 12. Reflux disease. 13. Rectal bleeding in the past.   14. Medication stroke.   15. History of pressure ulcer in lower back. 16. Hemorrhoids. 17. Questionable obstructive sleep apnea. 18. Worsening PFTs.   MEDICATIONS:  1. Triamcinolone 0.1% cream as needed. 2. Nitrostat 0.4 mg sublingually as needed. 3. Aspirin 81 mg Campbell.o. daily.  4. Lasix 80 mg Campbell.o. twice daily.  5. Spiriva 18 mg inhalation daily. 6. Metoprolol 25 mg Campbell.o. twice daily. 7. Buspirone 5 mg Campbell.o. twice daily. 8. Enalapril 5 mg Campbell.o. daily.  9. Proventil 2 puffs 4 times daily as needed.  10. Hydrocortisone 25 mg suppository twice daily.  11. Citalopram 20 mg Campbell.o. daily. 12. Allopurinol 100 mg Campbell.o. twice daily. 13. Phenobarbital 200 mg Campbell.o. daily at bedtime. 14. Hydrocodone/APAP 10/325 mg 1 tablet 4 times daily as needed.  15. Simvastatin 80 mg Campbell.o. daily.  16. Plendil 10 mg Campbell.o. daily. 17. Plavix 75 mg Campbell.o. daily. 18. Fluticasone 2 puffs to each nostril daily as needed.  19. Lamotrigine 50 mg Campbell.o. twice daily.   ALLERGIES: Penicillin as well as erythromycin.   FAMILY HISTORY: The patient's brother died of lung cancer. He was also smoker. The patient's father died of some kind of blood problems. The patient's mother died of unknown disease.   SOCIAL HISTORY: The patient lives with his wife. He smoked only very infrequently in his youth at the age of 81 or 3816 a few cigarettes in his life. He does not drink any alcohol. He used to work as a Naval architecttruck driver.   REVIEW OF SYSTEMS: Positive for blurring of vision. His weight seems to be stable at [facility]. Some cough, dyspnea, asthma, chest pain four days ago,  also constipation, however had diarrhea episode a day ago last night. Also, intermittent dysuria. Denies fevers, chills, fatigue, weakness, pains, weight loss or gain. In regards to eyes, denies any double vision, glaucoma, or cataracts. ENT: Denies any tinnitus, allergies, epistaxis, sinus pain, difficulty swallowing. RESPIRATORY: Denies any wheezes, asthma, or COPD. CARDIOVASCULAR:  Denies any orthopnea, edema, arrhythmias, palpitations, or syncope. GASTROINTESTINAL: Denies any nausea, vomiting, diarrhea, or constipation. The patient had some constipation in the past. GENITOURINARY: Denies any hematuria, frequency, or incontinence. ENDOCRINOLOGY: Denies any polydipsia, nocturia, thyroid problems, heat or cold intolerance, or thirst. HEMATOLOGIC: Denies any anemia, easy bruising, bleeding, or swollen glands. SKIN: Denies any acne, rash, change in moles. MUSCULOSKELETAL: Denies arthritis, cramps, swelling, gout. NEUROLOGIC: Denies any numbness, epilepsy, or tremor. PSYCHIATRIC: Denies anxiety, insomnia, or depression.    PHYSICAL EXAMINATION:  VITAL SIGNS: On arrival to the hospital, temperature 98.2, pulse 80, respiration rate 22, blood pressure 132/64, saturation 94% on room air.   GENERAL: This is a well developed, well nourished obese Caucasian male in no significant distress comfortable on the stretcher.   HEENT: Pupils are equal and reactive to light. Extraocular movements intact. No icterus or conjunctivitis. The patient does have some difficulty hearing. No pharyngeal erythema. Mucosa is moist.   NECK: No masses, supple, nontender. Thyroid not enlarged. No adenopathy. No JVD or carotid bruits bilaterally. Full range of motion. Thick sides of the neck. Difficult to evaluate the neck area vasculature. No carotid bruits bilaterally. Full range of motion.   LUNGS: Clear to auscultation though diminished breath sounds bilaterally. No significant rales or rhonchi. The patient does have diminished breath sounds and also wheezing, especially expiratory wheezing. The patient does have labored inspirations especially whenever he is exerting himself as well as increased effort to breathe. No dullness to percussion. In mild respiratory distress whenever he is exerting, whenever he tries to sit up.   CARDIOVASCULAR: S1, S2. No murmurs, gallops, or rubs noted. PMI not lateralized. Chest  is nontender to palpation. 1+ pedal pulses. 1 to 2+ lower extremity edema. No calf tenderness or cyanosis was noted.   ABDOMEN: Protuberant, moderately firm, nontender. Minimally uncomfortable on palpation. No hepatosplenomegaly or masses were noted.  RECTAL: Deferred.  MUSCULOSKELETAL: Muscle strength able to move extremities. No cyanosis, degenerative joint disease, or kyphosis. Gait is not tested.   SKIN: No rashes, lesions, erythema, nodularity, or induration. It was warm and dry to palpation. Posterior and buttocks area is not evaluated due to difficulty moving patient.   LYMPH: No adenopathy in the cervical region.   NEUROLOGICAL: Cranial nerves grossly intact. Sensory is intact. No dysarthria or aphasia. The patient is alert and oriented to time, person, and place, cooperative. Memory is good.   PSYCHIATRIC: No significant confusion, agitation, or depression noted.   LABORATORY, DIAGNOSTIC, AND RADIOLOGICAL DATA: Glucose 160, BUN and creatinine of 65 and 1.76, sodium 134, otherwise BMP is unremarkable. B-type Natriuretic Peptide was 6654. Liver enzymes were normal. Cardiac enzymes, first set, negative. CBC white blood cell count 9.2, hemoglobin 11.5, platelet count 187. D-dimer elevated at 1.54. Urinalysis yellow clear urine, negative for glucose, bilirubin, or ketones, specific gravity 1.006, pH 6.0, negative for blood, protein, nitrites, or leukocyte esterase, less than 1 red blood cell or white blood cells. No bacteria or epithelial cells. One hyaline cast was noted.   Chest x-ray looks like the patient has bilateral atelectasis.   EKG showed atrial fibrillation at 74 beats per minute, anteroseptal infarct age undetermined, ST-T wave abnormality, consider lateral ischemia  or digitalis effect according to EKG criteria. Abnormal EKG. No EKG to compare with.   ASSESSMENT AND PLAN:  1. COPD exacerbation. Admit patient to medical floor. Start him on steroids, inhalation therapy with  DuoNebs, also adding Symbicort.  Will continue oxygen therapy and will follow his improvement.  2. Chest pain. Start patient on aspirin as well as beta-blockers. Get Myoview.  3. Congestive heart failure, right heart. Will continue patient on Lasix IV following the patient's creatinine levels. 4. Questionable obstructive sleep apnea. Get nocturnal oximetry study on room air. The patient may need a sleep study.  5. Renal insufficiency, chronic. Follow with diuresis.  6. History of hypertension. Continue outpatient medications.  7. History of seizure disorder. Continue outpatient medications. 8. History of gout. Continue outpatient medications.  9. History of pulmonary embolism. The patient is not on any prophylactic medications. It is unclear if he has any pulmonary embolism now. Will schedule V/Q scan for tomorrow as well as Doppler ultrasound of lower extremities to rule out DVT.   TIME SPENT: One hour.   ____________________________ Katharina Caper, MD rv:drc D: 04/09/2012 19:51:21 ET T: 04/10/2012 06:52:59 ET JOB#: [id number]  cc: Katharina Caper, MD, <Dictator> Arman Loy MD ELECTRONICALLY SIGNED 04/29/2012 18:35
